# Patient Record
Sex: Female | Born: 1980 | Race: Black or African American | Hispanic: No | Marital: Married | Smoking: Never smoker
Health system: Southern US, Community
[De-identification: ages and names within clinical notes are randomized; demographics above are authoritative.]

## PROBLEM LIST (undated history)

## (undated) ENCOUNTER — Emergency Department (HOSPITAL_COMMUNITY): Admission: EM | Payer: 59

## (undated) DIAGNOSIS — K649 Unspecified hemorrhoids: Secondary | ICD-10-CM

## (undated) DIAGNOSIS — D219 Benign neoplasm of connective and other soft tissue, unspecified: Secondary | ICD-10-CM

## (undated) DIAGNOSIS — T7840XA Allergy, unspecified, initial encounter: Secondary | ICD-10-CM

## (undated) DIAGNOSIS — K219 Gastro-esophageal reflux disease without esophagitis: Secondary | ICD-10-CM

## (undated) DIAGNOSIS — J302 Other seasonal allergic rhinitis: Secondary | ICD-10-CM

## (undated) DIAGNOSIS — D649 Anemia, unspecified: Secondary | ICD-10-CM

## (undated) DIAGNOSIS — Z973 Presence of spectacles and contact lenses: Secondary | ICD-10-CM

## (undated) DIAGNOSIS — D259 Leiomyoma of uterus, unspecified: Secondary | ICD-10-CM

## (undated) DIAGNOSIS — K5190 Ulcerative colitis, unspecified, without complications: Secondary | ICD-10-CM

## (undated) DIAGNOSIS — D509 Iron deficiency anemia, unspecified: Secondary | ICD-10-CM

## (undated) DIAGNOSIS — D500 Iron deficiency anemia secondary to blood loss (chronic): Secondary | ICD-10-CM

## (undated) DIAGNOSIS — K429 Umbilical hernia without obstruction or gangrene: Secondary | ICD-10-CM

## (undated) HISTORY — DX: Gastro-esophageal reflux disease without esophagitis: K21.9

## (undated) HISTORY — PX: NO PAST SURGERIES: SHX2092

## (undated) HISTORY — DX: Benign neoplasm of connective and other soft tissue, unspecified: D21.9

## (undated) HISTORY — DX: Allergy, unspecified, initial encounter: T78.40XA

## (undated) HISTORY — DX: Ulcerative colitis, unspecified, without complications: K51.90

## (undated) HISTORY — DX: Anemia, unspecified: D64.9

---

## 2002-02-09 ENCOUNTER — Other Ambulatory Visit: Admission: RE | Admit: 2002-02-09 | Discharge: 2002-02-09 | Payer: Self-pay | Admitting: Internal Medicine

## 2009-10-15 ENCOUNTER — Emergency Department (HOSPITAL_COMMUNITY): Admission: EM | Admit: 2009-10-15 | Discharge: 2009-10-15 | Payer: Self-pay | Admitting: Emergency Medicine

## 2011-02-19 ENCOUNTER — Inpatient Hospital Stay (INDEPENDENT_AMBULATORY_CARE_PROVIDER_SITE_OTHER)
Admission: RE | Admit: 2011-02-19 | Discharge: 2011-02-19 | Disposition: A | Discharge status: Home / Self Care | Payer: 59 | Source: Ambulatory Visit | Attending: Family Medicine | Admitting: Family Medicine

## 2011-02-19 DIAGNOSIS — B354 Tinea corporis: Secondary | ICD-10-CM

## 2012-05-14 ENCOUNTER — Encounter (HOSPITAL_COMMUNITY): Payer: Self-pay

## 2012-05-14 ENCOUNTER — Emergency Department (HOSPITAL_COMMUNITY)
Admission: EM | Admit: 2012-05-14 | Discharge: 2012-05-14 | Disposition: A | Discharge status: Home / Self Care | Payer: 59 | Source: Home / Self Care | Attending: Emergency Medicine | Admitting: Emergency Medicine

## 2012-05-14 DIAGNOSIS — L259 Unspecified contact dermatitis, unspecified cause: Secondary | ICD-10-CM

## 2012-05-14 DIAGNOSIS — L309 Dermatitis, unspecified: Secondary | ICD-10-CM

## 2012-05-14 DIAGNOSIS — K2970 Gastritis, unspecified, without bleeding: Secondary | ICD-10-CM

## 2012-05-14 LAB — POCT URINALYSIS DIP (DEVICE)
Glucose, UA: NEGATIVE mg/dL
Hgb urine dipstick: NEGATIVE
Leukocytes, UA: NEGATIVE
Nitrite: NEGATIVE
Urobilinogen, UA: 0.2 mg/dL (ref 0.0–1.0)
pH: 6 (ref 5.0–8.0)

## 2012-05-14 LAB — POCT H PYLORI SCREEN: H. PYLORI SCREEN, POC: NEGATIVE

## 2012-05-14 MED ORDER — GI COCKTAIL ~~LOC~~
30.0000 mL | Freq: Once | ORAL | Status: AC
  Administered 2012-05-14: 30 mL via ORAL

## 2012-05-14 MED ORDER — SUCRALFATE 1 GM/10ML PO SUSP: 1.0000 g | Freq: Four times a day (QID) | ORAL | Status: DC

## 2012-05-14 MED ORDER — KETOCONAZOLE-HYDROCORTISONE 2 & 1 % (CREAM) EX KIT: 1.0000 "application " | PACK | Freq: Two times a day (BID) | CUTANEOUS | Status: DC

## 2012-05-14 MED ORDER — OMEPRAZOLE 20 MG PO CPDR: 20.0000 mg | DELAYED_RELEASE_CAPSULE | Freq: Two times a day (BID) | ORAL | Status: DC

## 2012-05-14 MED ORDER — GI COCKTAIL ~~LOC~~
ORAL | Status: AC
  Filled 2012-05-14: qty 30

## 2012-05-14 MED ORDER — RANITIDINE HCL 150 MG PO CAPS: 150.0000 mg | ORAL_CAPSULE | Freq: Two times a day (BID) | ORAL | Status: DC

## 2012-05-14 NOTE — Discharge Instructions (Signed)
[facility]: [address] [place] [address] [phone]  [facility]: [address] [place] [address]   [phone]  [facility]: [address] [place] [address] [phone]  [facility] : [address] [place] [address] [phone]  [facility]: [address] [place] [address] [phone]  [facility]: [address] [place] [address] [phone]  Dr. Mahima Pandey [address] [facility] [place]  [address] [phone]  

## 2012-05-14 NOTE — ED Notes (Signed)
Pt states she started with epigastric pain on Wednesday one day after finishing her period.  States it was a burning achy feeling that she noticed after eating.

## 2012-05-14 NOTE — ED Provider Notes (Signed)
History     CSN: [id number]  Arrival date & time 05/14/12  1127   First MD Initiated Contact with Patient 05/14/12 1136      Chief Complaint  Patient presents with  . Abdominal Pain    (Consider location/radiation/quality/duration/timing/severity/associated sxs/prior treatment) HPI Comments: Patient presents with 2 complaints: First, patient reports burning epigastric pain, which is worse after eating over the past 2 days. She has been drinking alcohol in the evenings over the past 2 days, she states she does not drink frequently. She was not having any symptoms prior to drinking. States she just finished menses, but denies taking anything other than Tylenol for her menstrual pain.  Reports some increased belching. No nausea, vomiting, abdominal distention, change in bowel habit, urinary complaints, vaginal complaints. No other abdominal pain. Patient does not smoke. She does report some increased stress. She is also taking some prenatal vitamins with iron in them, and this is relatively new for her.  Second, patient reports a itchy, flat, dry patchy skin over her forehead starting about a month and a half ago. States this started after she burned her forehead with a curling iron.  it has not spread since it started. No nausea, vomiting, rash elsewhere. She tried a leftover prescription of betamethasone, with moderate relief, but she states she ran out. States this is identical to the dermatitis that she had previously, for which she was prescribed topical steroids by a dermatologist. She is requesting another prescription.   ROS as noted in HPI. All other ROS negative.   Patient is a 31 y.o. female presenting with abdominal pain and rash. The history is provided by the patient. No language interpreter was used.  Abdominal Pain The primary symptoms of the illness include abdominal pain. The current episode started 2 days ago. The onset of the illness was sudden.  The illness is associated  with alcohol use. The patient states that she believes she is currently not pregnant. The patient has not had a change in bowel habit. Symptoms associated with the illness do not include chills, anorexia, diaphoresis, heartburn, constipation, urgency, hematuria, frequency or back pain. Significant associated medical issues do not include PUD, GERD, diabetes or gallstones.  Rash  This is a recurrent problem. The current episode started more than 1 week ago. The problem has not changed since onset.The problem is associated with an unknown factor. There has been no fever. The rash is present on the face. The pain has been constant since onset. Associated symptoms include itching. Pertinent negatives include no blisters, no pain and no weeping. Treatments tried: betamethasone cream. The treatment provided moderate relief.    History reviewed. No pertinent past medical history.  History reviewed. No pertinent past surgical history.  History reviewed. No pertinent family history.  History  Substance Use Topics  . Smoking status: Never Smoker   . Smokeless tobacco: Not on file  . Alcohol Use: Yes     occ     OB History    Grav Para Term Preterm Abortions TAB SAB Ect Mult Living                  Review of Systems  Constitutional: Negative for chills and diaphoresis.  Gastrointestinal: Positive for abdominal pain. Negative for heartburn, constipation and anorexia.  Genitourinary: Negative for urgency, frequency and hematuria.  Musculoskeletal: Negative for back pain.  Skin: Positive for itching and rash.    Allergies  Review of patient's allergies indicates no known allergies.  Home Medications  Current Outpatient Rx  Name Route Sig Dispense Refill  . ACETAMINOPHEN 325 MG PO TABS Oral Take 650 mg by mouth every 6 (six) hours as needed.    Marland Kitchen PRENATAL MULTIVITAMIN CH Oral Take 1 tablet by mouth daily.    Marland Kitchen KETOCONAZOLE-HYDROCORTISONE 2 & 1 % (CREAM) EX KIT Apply externally Apply 1  application topically 2 (two) times daily. X 2 weeks 1 kit 0  . OMEPRAZOLE 20 MG PO CPDR Oral Take 1 capsule (20 mg total) by mouth 2 (two) times daily. X 10 days 40 capsule 0  . RANITIDINE HCL 150 MG PO CAPS Oral Take 1 capsule (150 mg total) by mouth 2 (two) times daily. 30 capsule 0  . SUCRALFATE 1 GM/10ML PO SUSP Oral Take 10 mLs (1 g total) by mouth 4 (four) times daily. 10 mL before meals and before bedtime 240 mL 0    BP 150/94  Pulse 86  Temp 98.7 F (37.1 C) (Oral)  Resp 18  SpO2 100%  LMP 05/10/2012  Physical Exam  Nursing note and vitals reviewed. Constitutional: She is oriented to person, place, and time. She appears well-developed and well-nourished.  HENT:  Head: Normocephalic and atraumatic.         Flat, scaly, hyperpigmented rash, see drawing. No signs of infection.  Eyes: Conjunctivae and EOM are normal.  Neck: Normal range of motion.  Cardiovascular: Normal rate, regular rhythm, normal heart sounds and intact distal pulses.   No murmur heard. Pulmonary/Chest: Effort normal and breath sounds normal.  Abdominal: Soft. Normal appearance and bowel sounds are normal. She exhibits no distension and no mass. There is tenderness in the epigastric area. There is no rigidity, no rebound, no guarding, no CVA tenderness, no tenderness at McBurney's point and negative Murphy's sign.    Musculoskeletal: Normal range of motion.  Neurological: She is alert and oriented to person, place, and time.  Skin: Skin is warm and dry. Rash noted.       see HEENT exam  Psychiatric: She has a normal mood and affect. Her behavior is normal. Judgment and thought content normal.    ED Course  Procedures (including critical care time)   Labs Reviewed  POCT URINALYSIS DIP (DEVICE)  POCT PREGNANCY, URINE  POCT H PYLORI SCREEN   No results found.   1. Gastritis   2. Dermatitis    Results for orders placed during the hospital encounter of 05/14/12  POCT URINALYSIS DIP (DEVICE)       Component Value Range   Glucose, UA NEGATIVE  NEGATIVE mg/dL   Bilirubin Urine NEGATIVE  NEGATIVE   Ketones, ur NEGATIVE  NEGATIVE mg/dL   Specific Gravity, Urine 1.020  1.005 - 1.030   Hgb urine dipstick NEGATIVE  NEGATIVE   pH 6.0  5.0 - 8.0   Protein, ur NEGATIVE  NEGATIVE mg/dL   Urobilinogen, UA 0.2  0.0 - 1.0 mg/dL   Nitrite NEGATIVE  NEGATIVE   Leukocytes, UA NEGATIVE  NEGATIVE  POCT PREGNANCY, URINE      Component Value Range   Preg Test, Ur NEGATIVE  NEGATIVE  POCT H PYLORI SCREEN      Component Value Range   H. PYLORI SCREEN, POC NEGATIVE  NEGATIVE    MDM  Previous records reviewed. Noncontributory. H&P most consistent with gastritis. H. pylori negative. Pt abd exam is benign, no peritoneal signs. No evidence of surgical abd. Doubt SBO, mesenteric ischemia, appendicitis, hepatitis, cholecystitis, pancreatitis, or perforated viscus. Will have her discontinue the prenatal vitamins,  send her home with H2, PPI, Carafate. She also has a dermatitis on her face, suspect secondary yeast infection because of the itching. Will send her home with ketoconazole/hydrocortisone. We'll refer her to primary care practices for routine care. Discussed signs and symptoms that should prompt return. Patient agrees with plan.  Luiz Blare, MD 05/14/12 [phone]

## 2012-09-16 ENCOUNTER — Emergency Department (HOSPITAL_COMMUNITY)
Admission: EM | Admit: 2012-09-16 | Discharge: 2012-09-16 | Disposition: A | Discharge status: Home / Self Care | Payer: 59 | Source: Home / Self Care | Attending: Emergency Medicine | Admitting: Emergency Medicine

## 2012-09-16 ENCOUNTER — Encounter (HOSPITAL_COMMUNITY): Payer: Self-pay

## 2012-09-16 DIAGNOSIS — M79609 Pain in unspecified limb: Secondary | ICD-10-CM

## 2012-09-16 DIAGNOSIS — M79644 Pain in right finger(s): Secondary | ICD-10-CM

## 2012-09-16 MED ORDER — CEPHALEXIN 500 MG PO CAPS: 500.0000 mg | ORAL_CAPSULE | Freq: Two times a day (BID) | ORAL | Status: DC

## 2012-09-16 NOTE — ED Notes (Signed)
States she pulled off a hangnail a few days ago , and since then, has had swelling, pain in her right thumb

## 2012-09-16 NOTE — ED Provider Notes (Signed)
History     CSN: [id number]  Arrival date & time 09/16/12  1422   None     Chief Complaint  Patient presents with  . Hand Pain    (Consider location/radiation/quality/duration/timing/severity/associated sxs/prior treatment) Patient is a 31 y.o. female presenting with hand pain. The history is provided by the patient.  Hand Pain This is a new problem.  patient reports hang nail on right thumb removed one week ago.  Since then she has had swelling and tenderness to right thumb, improved with warm soaks.  States pain is worse with movement and palpation.  No prior history of same.  No change in hand function.  History reviewed. No pertinent past medical history.  History reviewed. No pertinent past surgical history.  History reviewed. No pertinent family history.  History  Substance Use Topics  . Smoking status: Never Smoker   . Smokeless tobacco: Not on file  . Alcohol Use: Yes     occ     OB History    Grav Para Term Preterm Abortions TAB SAB Ect Mult Living                  Review of Systems  Constitutional: Negative.   Respiratory: Negative.   Cardiovascular: Negative.   Musculoskeletal: Positive for arthralgias.  Skin: Negative.   Neurological: Negative.     Allergies  Review of patient's allergies indicates no known allergies.  Home Medications   Current Outpatient Rx  Name Route Sig Dispense Refill  . ACETAMINOPHEN 325 MG PO TABS Oral Take 650 mg by mouth every 6 (six) hours as needed.    . CEPHALEXIN 500 MG PO CAPS Oral Take 1 capsule (500 mg total) by mouth 2 (two) times daily. 20 capsule 0  . KETOCONAZOLE-HYDROCORTISONE 2 & 1 % (CREAM) EX KIT Apply externally Apply 1 application topically 2 (two) times daily. X 2 weeks 1 kit 0  . OMEPRAZOLE 20 MG PO CPDR Oral Take 1 capsule (20 mg total) by mouth 2 (two) times daily. X 10 days 40 capsule 0  . PRENATAL MULTIVITAMIN CH Oral Take 1 tablet by mouth daily.    Marland Kitchen RANITIDINE HCL 150 MG PO CAPS Oral Take 1  capsule (150 mg total) by mouth 2 (two) times daily. 30 capsule 0  . SUCRALFATE 1 GM/10ML PO SUSP Oral Take 10 mLs (1 g total) by mouth 4 (four) times daily. 10 mL before meals and before bedtime 240 mL 0    BP 127/75  Pulse 104  Temp 98.9 F (37.2 C) (Oral)  Resp 12  SpO2 99%  Physical Exam  Nursing note and vitals reviewed. Constitutional: She is oriented to person, place, and time. Vital signs are normal. She appears well-developed and well-nourished. She is active and cooperative.  HENT:  Head: Normocephalic.  Eyes: Conjunctivae normal are normal. Pupils are equal, round, and reactive to light. No scleral icterus.  Neck: Trachea normal. Neck supple.  Cardiovascular: Normal rate and regular rhythm.   Pulmonary/Chest: Effort normal and breath sounds normal.  Musculoskeletal:       Right wrist: Normal.       Right hand: Normal.       Hands: Neurological: She is alert and oriented to person, place, and time. She has normal strength and normal reflexes. No cranial nerve deficit or sensory deficit. GCS eye subscore is 4. GCS verbal subscore is 5. GCS motor subscore is 6.  Skin: Skin is warm and dry.  Psychiatric: She has a normal mood  and affect. Her speech is normal and behavior is normal. Judgment and thought content normal. Cognition and memory are normal.    ED Course  Procedures (including critical care time)  Labs Reviewed - No data to display No results found.   1. Finger pain, right       MDM  Take antibiotics as prescribed, ibuprofen for pain, continue warm soaks.        Dana Kindred, NP 09/16/12 1631

## 2012-09-17 NOTE — ED Provider Notes (Signed)
Medical screening examination/treatment/procedure(s) were performed by non-physician practitioner and as supervising physician I was immediately available for consultation/collaboration.  Leslee Home, M.D.   Reuben Likes, MD 09/17/12 [phone]

## 2014-09-06 ENCOUNTER — Emergency Department (HOSPITAL_COMMUNITY)
Admission: EM | Admit: 2014-09-06 | Discharge: 2014-09-06 | Disposition: A | Discharge status: Home / Self Care | Payer: 59 | Source: Home / Self Care | Attending: Emergency Medicine | Admitting: Emergency Medicine

## 2014-09-06 ENCOUNTER — Encounter (HOSPITAL_COMMUNITY): Payer: Self-pay | Admitting: Emergency Medicine

## 2014-09-06 DIAGNOSIS — H8112 Benign paroxysmal vertigo, left ear: Secondary | ICD-10-CM

## 2014-09-06 MED ORDER — LORAZEPAM 1 MG PO TABS: 1.0000 mg | ORAL_TABLET | Freq: Three times a day (TID) | ORAL | Status: DC

## 2014-09-06 MED ORDER — MECLIZINE HCL 25 MG PO TABS: 25.0000 mg | ORAL_TABLET | Freq: Three times a day (TID) | ORAL | Status: DC | PRN

## 2014-09-06 NOTE — ED Notes (Signed)
Reports dizziness and ear pain that started 10/2, intermittent.  Dizziness asscoiated with changing of body position, noted room spinning with lying down.  Denies uri symptoms, bilateral ear pressure and popping

## 2014-09-06 NOTE — ED Provider Notes (Signed)
  Chief Complaint   Dizziness and Otalgia   History of Present Illness   Dana Campbell is a 33 year old female who has had a three-day history of bilateral ear congestion pressure and spinning vertigo. The this is intermittent and only occurs in certain positions. Is been accompanied by nausea. The patient notes diminished hearing and some ringing in the ears. She's had no fever, chills, headache, diplopia, or blurry vision. No nasal congestion, drainage, sore throat or stiff neck. She denies any chest pain, shortness of breath, or palpitations. She's had no paresthesias, muscle weakness, difficulty with speech or difficulty with ambulation.  Review of Systems   Other than noted above, the patient denies any of the following symptoms: Systemic:  No fever, chills, fatigue, or weight loss. Eye:  No blurred vision, visual change or diplopia. ENT:  No ear pain, tinnitus, hearing loss, nasal congestion, or rhinorrhea. Cardiac:  No chest pain, dyspnea, palpitations or syncope. Neuro:  No headache, paresthesias, weakness, trouble with speech, coordination or ambulation.  [place]   Past medical history, family history, social history, meds, and allergies were reviewed.    Physical Examination     Vital signs:  BP 136/92  Pulse 78  Temp(Src) 97.7 F (36.5 C) (Oral)  Resp 18  SpO2 99%  LMP 08/18/2014 General:  Alert, oriented times 3, in no distress. Eye:  PERRL, full EOM, no nystagmus at rest. ENT:  TMs and canals normal.  Nasal mucosa normal.  Pharynx clear. Neck:  No adenopathy, tenderness, or mass.  Thyroid normal.  No carotid bruit. Lungs:  Breath sounds clear and equal bilaterally.  No wheezes, rales or rhonchi. Heart:  Regular rhythm.  No gallops, murmers, or rubs. Neuro:  Alert and oriented times 3.  Cranial nerves intact.  No pronator drift.  Finger to nose normal.  No focal weakness.  Sensation intact to light touch.  Romberg's sign negative, gait normal.  Able to do tandem gait  well.  Dix-Hallpike maneuver was positive with the left ear down with visible nystagmus.  [facility]   The Epley maneuver was done once.  Assessment   The encounter diagnosis was Benign positional vertigo, left.  No evidence of presyncope or central vertigo.    Plan     1.  Meds:  The following meds were prescribed:   Discharge Medication List as of 09/06/2014  9:55 AM    START taking these medications   Details  LORazepam (ATIVAN) 1 MG tablet Take 1 tablet (1 mg total) by mouth every 8 (eight) hours., Starting 09/06/2014, Until Discontinued, Print    meclizine (ANTIVERT) 25 MG tablet Take 1 tablet (25 mg total) by mouth 3 (three) times daily as needed for dizziness., Starting 09/06/2014, Until Discontinued, Normal        2.  Patient Education/Counseling:  The patient was given appropriate handouts, self care instructions, and instructed in symptomatic relief.  She was given an Epley exercises to do. If no better in a week suggested she followup with ENT.  3.  Follow up:  The patient was told to follow up here if no better in 3 to 4 days, or sooner if becoming worse in any way, and given some red flag symptoms such as new neurological symptoms, chest pain, or syncope which would prompt immediate return.       Harden Mo, MD 09/06/14 1351

## 2014-09-06 NOTE — Discharge Instructions (Signed)
You have been diagnosed with benign positional vertigo.  The cause of this is a displaced particle or crystal in the inner ear.  This can be cured by doing the exercise described below, called the Epley exercise developed by Dr. Horald Chestnut.  Do this exercise three times daily until you are able to do it for 24 hours without getting dizzy.  Sleep with your head elevated and try to avoid bending over.  If the dizziness is not better in 7 days, return here, see your primary care doctor or ENT doctor.    Benign Positional Vertigo Vertigo means you feel like you or your surroundings are moving when they are not. Benign positional vertigo is the most common form of vertigo. Benign means that the cause of your condition is not serious. Benign positional vertigo is more common in older adults. CAUSES  Benign positional vertigo is the result of an upset in the labyrinth system. This is an area in the middle ear that helps control your balance. This may be caused by a viral infection, head injury, or repetitive motion. However, often no specific cause is found. SYMPTOMS  Symptoms of benign positional vertigo occur when you move your head or eyes in different directions. Some of the symptoms may include:  Loss of balance and falls.  Vomiting.  Blurred vision.  Dizziness.  Nausea.  Involuntary eye movements (nystagmus). DIAGNOSIS  Benign positional vertigo is usually diagnosed by physical exam. If the specific cause of your benign positional vertigo is unknown, your caregiver may perform imaging tests, such as magnetic resonance imaging (MRI) or computed tomography (CT). TREATMENT  Your caregiver may recommend movements or procedures to correct the benign positional vertigo. Medicines such as meclizine, benzodiazepines, and medicines for nausea may be used to treat your symptoms. In rare cases, if your symptoms are caused by certain conditions that affect the inner ear, you may need surgery. HOME CARE  INSTRUCTIONS   Follow your caregiver's instructions.  Move slowly. Do not make sudden body or head movements.  Avoid driving.  Avoid operating heavy machinery.  Avoid performing any tasks that would be dangerous to you or others during a vertigo episode.  Drink enough fluids to keep your urine clear or pale yellow. SEEK IMMEDIATE MEDICAL CARE IF:   You develop problems with walking, weakness, numbness, or using your arms, hands, or legs.  You have difficulty speaking.  You develop severe headaches.  Your nausea or vomiting continues or gets worse.  You develop visual changes.  Your family or friends notice any behavioral changes.  Your condition gets worse.  You have a fever.  You develop a stiff neck or sensitivity to light. MAKE SURE YOU:   Understand these instructions.  Will watch your condition.  Will get help right away if you are not doing well or get worse. Document Released: 08/24/2006 Document Revised: 02/08/2012 Document Reviewed: 08/06/2011 [facility] Patient Information 2015 [place], [place]. This information is not intended to replace advice given to you by your health care provider. Make sure you discuss any questions you have with your health care provider.

## 2014-12-18 ENCOUNTER — Ambulatory Visit (INDEPENDENT_AMBULATORY_CARE_PROVIDER_SITE_OTHER): Payer: 59 | Admitting: Physician Assistant

## 2014-12-18 VITALS — BP 128/90 | HR 78 | Temp 98.7°F | Resp 18 | Ht 65.0 in | Wt 291.4 lb

## 2014-12-18 DIAGNOSIS — H00016 Hordeolum externum left eye, unspecified eyelid: Secondary | ICD-10-CM

## 2014-12-18 NOTE — Progress Notes (Signed)
   Subjective:    Patient ID: Dana Campbell, female    DOB: January 10, 1981, 34 y.o.   MRN: [id number]  HPI Pt presents to clinic with left lower eyelid swollen and painful.  Started yesterday and got worse this am and she got worried.  Yesterday she thought it was her sinuses s she took sudafed but then this am the pressure//pain was different and she has no cold symptoms.  No problems with her vision or eyeball.  She has never had problems with her BP before.  She knows she needs to loose weight and has lost 100lbs in the past but gained it back due to her stress eating with recent stressors.  She has a bad family h/o hypertension.  Review of Systems  Constitutional: Negative for fever and chills.  HENT: Negative for congestion and sinus pressure.   Eyes: Positive for discharge (tearing). Negative for photophobia, pain and visual disturbance.  Respiratory: Negative for cough.        Objective:   Physical Exam  Constitutional: She is oriented to person, place, and time. She appears well-developed and well-nourished.  BP 128/90 mmHg  Pulse 78  Temp(Src) 98.7 F (37.1 C) (Oral)  Resp 18  Ht 5\' 5"  (1.651 m)  Wt 291 lb 6.4 oz (132.178 kg)  BMI 48.49 kg/m2  SpO2 100%  LMP 12/08/2014   HENT:  Head: Normocephalic and atraumatic.  Right Ear: External ear normal.  Left Ear: External ear normal.  Eyes: Conjunctivae and EOM are normal. Pupils are equal, round, and reactive to light. Left eye exhibits hordeolum. Left eye exhibits no discharge. No foreign body present in the left eye.    No swelling at the lacrimal gland on the left, no erythema seen near tear duct.  Cardiovascular: Normal rate, regular rhythm and normal heart sounds.   No murmur heard. Pulmonary/Chest: Effort normal and breath sounds normal. She has no wheezes.  Neurological: She is alert and oriented to person, place, and time.  Skin: Skin is warm and dry.  Psychiatric: She has a normal mood and affect. Her behavior is  normal. Judgment and thought content normal.       Assessment & Plan:  Stye external, left   Elevated BP - pt to monitor her BP and if the diastolic stays elevated she will need evaluation  Warm compresses and no tears baby shampoo to wash out the area.  There is not sign on infection at this time.   Windell Hummingbird PA-C  [facility] 12/18/2014 11:40 AM

## 2014-12-18 NOTE — Patient Instructions (Addendum)
Warm compresses  Wynetta Emery and johnson baby shampoo no tear - to wash your eye  Sty A sty (hordeolum) is an infection of a gland in the eyelid located at the base of the eyelash. A sty may develop a white or yellow head of pus. It can be puffy (swollen). Usually, the sty will burst and pus will come out on its own. They do not leave lumps in the eyelid once they drain. A sty is often confused with another form of cyst of the eyelid called a chalazion. Chalazions occur within the eyelid and not on the edge where the bases of the eyelashes are. They often are red, sore and then form firm lumps in the eyelid. CAUSES   Germs (bacteria).  Lasting (chronic) eyelid inflammation. SYMPTOMS   Tenderness, redness and swelling along the edge of the eyelid at the base of the eyelashes.  Sometimes, there is a white or yellow head of pus. It may or may not drain. DIAGNOSIS  An ophthalmologist will be able to distinguish between a sty and a chalazion and treat the condition appropriately.  TREATMENT   Styes are typically treated with warm packs (compresses) until drainage occurs.  In rare cases, medicines that kill germs (antibiotics) may be prescribed. These antibiotics may be in the form of drops, cream or pills.  If a hard lump has formed, it is generally necessary to do a small incision and remove the hardened contents of the cyst in a minor surgical procedure done in the office.  In suspicious cases, your caregiver may send the contents of the cyst to the lab to be certain that it is not a rare, but dangerous form of cancer of the glands of the eyelid. HOME CARE INSTRUCTIONS   Wash your hands often and dry them with a clean towel. Avoid touching your eyelid. This may spread the infection to other parts of the eye.  Apply heat to your eyelid for 10 to 20 minutes, several times a day, to ease pain and help to heal it faster.  Do not squeeze the sty. Allow it to drain on its own. Wash your eyelid  carefully 3 to 4 times per day to remove any pus. SEEK IMMEDIATE MEDICAL CARE IF:   Your eye becomes painful or puffy (swollen).  Your vision changes.  Your sty does not drain by itself within 3 days.  Your sty comes back within a short period of time, even with treatment.  You have redness (inflammation) around the eye.  You have a fever. Document Released: 08/26/2005 Document Revised: 02/08/2012 Document Reviewed: 03/02/2014 [facility] Patient Information 2015 [place], [place]. This information is not intended to replace advice given to you by your health care provider. Make sure you discuss any questions you have with your health care provider.

## 2018-05-26 ENCOUNTER — Ambulatory Visit (INDEPENDENT_AMBULATORY_CARE_PROVIDER_SITE_OTHER): Payer: Self-pay | Admitting: Family Medicine

## 2018-05-26 VITALS — BP 120/88 | HR 78 | Temp 98.3°F | Resp 18 | Wt 286.8 lb

## 2018-05-26 DIAGNOSIS — Z0000 Encounter for general adult medical examination without abnormal findings: Secondary | ICD-10-CM

## 2018-05-26 NOTE — Progress Notes (Signed)
Dana Campbell is a 37 y.o. female who presents today with concerns of needs a PCP other wise healthy lost 68 lbs in last 6-7 months and works out [employer]. Denies chronic health conditions  Review of Systems  Constitutional: Negative for chills, fever and malaise/fatigue.  HENT: Negative for congestion, ear discharge, ear pain, sinus pain and sore throat.   Eyes: Negative.   Respiratory: Negative for cough, sputum production and shortness of breath.   Cardiovascular: Negative.  Negative for chest pain.  Gastrointestinal: Negative for abdominal pain, diarrhea, nausea and vomiting.  Genitourinary: Negative for dysuria, frequency, hematuria and urgency.  Musculoskeletal: Negative for myalgias.  Skin: Negative.   Neurological: Negative for headaches.  Endo/Heme/Allergies: Negative.   Psychiatric/Behavioral: Negative.     O: Vitals:   05/26/18 1737  BP: 120/88  Pulse: 78  Resp: 18  Temp: 98.3 F (36.8 C)  SpO2: 99%     Physical Exam  Constitutional: She is oriented to person, place, and time. Vital signs are normal. She appears well-developed and well-nourished. She is active.  Non-toxic appearance. She does not have a sickly appearance.  HENT:  Head: Normocephalic.  Right Ear: Hearing, tympanic membrane, external ear and ear canal normal.  Left Ear: Hearing, tympanic membrane, external ear and ear canal normal.  Nose: Nose normal.  Mouth/Throat: Uvula is midline and oropharynx is clear and moist.  Neck: Normal range of motion. Neck supple.  Cardiovascular: Normal rate, regular rhythm, normal heart sounds and normal pulses.  Pulmonary/Chest: Effort normal and breath sounds normal.  Abdominal: Soft. Bowel sounds are normal.  Musculoskeletal: Normal range of motion.  Lymphadenopathy:       Head (right side): No submental and no submandibular adenopathy present.       Head (left side): No submental and no submandibular adenopathy present.    She has no cervical adenopathy.   Neurological: She is alert and oriented to person, place, and time.  Psychiatric: She has a normal mood and affect.  PHQ-9- negative  Vitals reviewed.    A: 1. Physical exam      P: Exam findings, diagnosis etiology and medication use and indications reviewed with patient. Follow- Up and discharge instructions provided. No emergent/urgent issues found on exam.  Patient verbalized understanding of information provided and agrees with plan of care (POC), all questions answered.  1. Physical exam WNL

## 2018-05-26 NOTE — Patient Instructions (Signed)

## 2018-09-14 ENCOUNTER — Ambulatory Visit (HOSPITAL_COMMUNITY)
Admission: EM | Admit: 2018-09-14 | Discharge: 2018-09-14 | Disposition: A | Discharge status: Home / Self Care | Payer: 59 | Attending: Family Medicine | Admitting: Family Medicine

## 2018-09-14 ENCOUNTER — Encounter (HOSPITAL_COMMUNITY): Payer: Self-pay | Admitting: Emergency Medicine

## 2018-09-14 ENCOUNTER — Other Ambulatory Visit: Payer: Self-pay

## 2018-09-14 DIAGNOSIS — R5383 Other fatigue: Secondary | ICD-10-CM | POA: Diagnosis not present

## 2018-09-14 DIAGNOSIS — R509 Fever, unspecified: Secondary | ICD-10-CM

## 2018-09-14 DIAGNOSIS — R000 Tachycardia, unspecified: Secondary | ICD-10-CM

## 2018-09-14 LAB — CBC WITH DIFFERENTIAL/PLATELET
Abs Immature Granulocytes: 0.04 10*3/uL (ref 0.00–0.07)
BASOS ABS: 0 10*3/uL (ref 0.0–0.1)
Basophils Relative: 0 %
EOS PCT: 0 %
Eosinophils Absolute: 0 10*3/uL (ref 0.0–0.5)
HCT: 39.2 % (ref 36.0–46.0)
HEMOGLOBIN: 12.6 g/dL (ref 12.0–15.0)
Immature Granulocytes: 0 %
LYMPHS ABS: 0.8 10*3/uL (ref 0.7–4.0)
LYMPHS PCT: 8 %
MCH: 28.3 pg (ref 26.0–34.0)
MCHC: 32.1 g/dL (ref 30.0–36.0)
MCV: 88.1 fL (ref 80.0–100.0)
MONO ABS: 1.2 10*3/uL — AB (ref 0.1–1.0)
Monocytes Relative: 11 %
NRBC: 0 % (ref 0.0–0.2)
Neutro Abs: 8.5 10*3/uL — ABNORMAL HIGH (ref 1.7–7.7)
Neutrophils Relative %: 81 %
Platelets: 194 10*3/uL (ref 150–400)
RBC: 4.45 MIL/uL (ref 3.87–5.11)
RDW: 14 % (ref 11.5–15.5)
WBC: 10.5 10*3/uL (ref 4.0–10.5)

## 2018-09-14 LAB — POCT URINALYSIS DIP (DEVICE)
GLUCOSE, UA: NEGATIVE mg/dL
KETONES UR: 40 mg/dL — AB
Leukocytes, UA: NEGATIVE
Nitrite: NEGATIVE
PH: 6 (ref 5.0–8.0)
PROTEIN: 100 mg/dL — AB
UROBILINOGEN UA: 0.2 mg/dL (ref 0.0–1.0)

## 2018-09-14 MED ORDER — ACETAMINOPHEN 325 MG PO TABS
650.0000 mg | ORAL_TABLET | Freq: Once | ORAL | Status: AC
  Administered 2018-09-14: 650 mg via ORAL

## 2018-09-14 MED ORDER — DOXYCYCLINE HYCLATE 100 MG PO CAPS: 100.0000 mg | ORAL_CAPSULE | Freq: Two times a day (BID) | ORAL | 0 refills | Status: DC

## 2018-09-14 MED ORDER — ACETAMINOPHEN 325 MG PO TABS
ORAL_TABLET | ORAL | Status: AC
  Filled 2018-09-14: qty 2

## 2018-09-14 MED FILL — DOXYCYCLINE HYC 100 MG CAPS: 100 | 10 days supply | Qty: 20 | Fill #0

## 2018-09-14 NOTE — Discharge Instructions (Addendum)
Urine did not show signs of urinary tract infection Blood work obtained.  We will follow up with you regarding the results We will start you on doxycycline for empiric treatment of tick borne illness Continue to alternate ibuprofen and tylenol every 6-8 hours Please schedule appointment with PCP this week or next week for reevaluation Return or go to the ED if you have any new or worsening symptoms such as fever that does not moderate with tylenol or ibuprofen, cough, abdominal pain, vomiting, diarrhea, neck pain, severe headache, rash, etc..Marland Kitchen

## 2018-09-14 NOTE — ED Notes (Signed)
Notified dr hagler of patient's fever

## 2018-09-14 NOTE — ED Provider Notes (Addendum)
[facility]   [id number] 09/14/18 Arrival Time: 3875  IE:PPIRJ  SUBJECTIVE: History from: patient.  Dana Campbell is a 37 y.o. female who presents with complaint of fever that began 3 days ago.  Tmax at home was 103 last night, 102.8 in office today.  Denies precipitating event or positive sick exposure.  Denies recent travel or known tick exposure, but did go on a hike recently.  States she is in a monogamous relationship with her female partner of 1 year.  Has tried OTC tylenol with relief.  Denies aggravating or alleviating factors.  Denies similar symptoms in the past. Complains of decreased appetite and fatigue.  Denies ear pain, sore throat, sinus pain/pressure, dental pain, neck pain or stiffness, chest pain, SOB, cough, abdominal pain, vomiting, diarrhea, dysuria, urinary urgency, or rash.    There is no immunization history on file for this patient.   Currently on menstrual cycle  ROS: As per HPI.  History reviewed. No pertinent past medical history. History reviewed. No pertinent surgical history. Allergies  Allergen Reactions  . Cinnamon Itching   No current facility-administered medications on file prior to encounter.    Current Outpatient Medications on File Prior to Encounter  Medication Sig Dispense Refill  . acetaminophen (TYLENOL) 325 MG tablet Take 650 mg by mouth every 6 (six) hours as needed.    . meclizine (ANTIVERT) 25 MG tablet Take 1 tablet (25 mg total) by mouth 3 (three) times daily as needed for dizziness. 30 tablet 0  . ranitidine (ZANTAC) 150 MG capsule Take 1 capsule (150 mg total) by mouth 2 (two) times daily. 30 capsule 0   Social History   Socioeconomic History  . Marital status: Unknown    Spouse name: Not on file  . Number of children: Not on file  . Years of education: Not on file  . Highest education level: Not on file  Occupational History  . Not on file  Social Needs  . Financial resource strain: Not on file  . Food  insecurity:    Worry: Not on file    Inability: Not on file  . Transportation needs:    Medical: Not on file    Non-medical: Not on file  Tobacco Use  . Smoking status: Never Smoker  Substance and Sexual Activity  . Alcohol use: Yes    Comment: occ   . Drug use: No  . Sexual activity: Not on file  Lifestyle  . Physical activity:    Days per week: Not on file    Minutes per session: Not on file  . Stress: Not on file  Relationships  . Social connections:    Talks on phone: Not on file    Gets together: Not on file    Attends religious service: Not on file    Active member of club or organization: Not on file    Attends meetings of clubs or organizations: Not on file    Relationship status: Not on file  . Intimate partner violence:    Fear of current or ex partner: Not on file    Emotionally abused: Not on file    Physically abused: Not on file    Forced sexual activity: Not on file  Other Topics Concern  . Not on file  Social History Narrative  . Not on file   History reviewed. No pertinent family history.  OBJECTIVE:  Vitals:   09/14/18 0844 09/14/18 0936  BP: (!) 134/100   Pulse: (!) 111 Marland Kitchen)  115  Resp: 18 20  Temp: (!) 102.8 F (39.3 C) (!) 102.8 F (39.3 C)  TempSrc:  Oral  SpO2: 96% 100%     General appearance: alert; appears acutely ill HEENT: NCAT; Ears: EACs clear, TMs pearly gray; Eyes: PERRL.  EOM grossly intact.  Nose: no rhinorrhea; tonsils nonerythematous, uvula midline Neck: supple without LAD Lungs: CTAB Heart: Tachycardia.  Radial pulses 2+ symmetrical bilaterally Abdomen: soft, nondistended, normal active bowel sounds; nontender to palpation; no guarding Back: no CVA tenderness Skin: warm and dry Psychological: alert and cooperative; normal mood and affect  Results for orders placed or performed during the hospital encounter of 09/14/18 (from the past 24 hour(s))  POCT urinalysis dip (device)     Status: Abnormal   Collection Time:  09/14/18  9:42 AM  Result Value Ref Range   Glucose, UA NEGATIVE NEGATIVE mg/dL   Bilirubin Urine SMALL (A) NEGATIVE   Ketones, ur 40 (A) NEGATIVE mg/dL   Specific Gravity, Urine >=1.030 1.005 - 1.030   Hgb urine dipstick MODERATE (A) NEGATIVE   pH 6.0 5.0 - 8.0   Protein, ur 100 (A) NEGATIVE mg/dL   Urobilinogen, UA 0.2 0.0 - 1.0 mg/dL   Nitrite NEGATIVE NEGATIVE   Leukocytes, UA NEGATIVE NEGATIVE  CBC with Differential     Status: Abnormal   Collection Time: 09/14/18  9:49 AM  Result Value Ref Range   WBC 10.5 4.0 - 10.5 K/uL   RBC 4.45 3.87 - 5.11 MIL/uL   Hemoglobin 12.6 12.0 - 15.0 g/dL   HCT 39.2 36.0 - 46.0 %   MCV 88.1 80.0 - 100.0 fL   MCH 28.3 26.0 - 34.0 pg   MCHC 32.1 30.0 - 36.0 g/dL   RDW 14.0 11.5 - 15.5 %   Platelets 194 150 - 400 K/uL   nRBC 0.0 0.0 - 0.2 %   Neutrophils Relative % 81 %   Neutro Abs 8.5 (H) 1.7 - 7.7 K/uL   Lymphocytes Relative 8 %   Lymphs Abs 0.8 0.7 - 4.0 K/uL   Monocytes Relative 11 %   Monocytes Absolute 1.2 (H) 0.1 - 1.0 K/uL   Eosinophils Relative 0 %   Eosinophils Absolute 0.0 0.0 - 0.5 K/uL   Basophils Relative 0 %   Basophils Absolute 0.0 0.0 - 0.1 K/uL   Immature Granulocytes 0 %   Abs Immature Granulocytes 0.04 0.00 - 0.07 K/uL   Polychromasia PRESENT     ASSESSMENT & PLAN:  1. Fever, unspecified   2. Tachycardia    Reviewed blood work with Dr. Mannie Stabile.  No concerning findings today.  Patient instructed to follow up with PCP for reevaluation next week.    Meds ordered this encounter  Medications  . acetaminophen (TYLENOL) tablet 650 mg  . doxycycline (VIBRAMYCIN) 100 MG capsule    Sig: Take 1 capsule (100 mg total) by mouth 2 (two) times daily.    Dispense:  20 capsule    Refill:  0    Order Specific Question:   Supervising Provider    Answer:   Wynona Luna [[id number]]    Urine did not show signs of urinary tract infection Blood work obtained.  We will follow up with you regarding the results We will start  you on doxycycline for empiric treatment of tick borne illness Continue to alternate ibuprofen and tylenol every 6-8 hours Please schedule appointment with PCP this week or next week for reevaluation Return or go to the ED if you have any  new or worsening symptoms such as fever that does not moderate with tylenol or ibuprofen, cough, abdominal pain, vomiting, diarrhea, neck pain, severe headache, rash, etc...  Reviewed expectations re: course of current medical issues. Questions answered. Outlined signs and symptoms indicating need for more acute intervention. Patient verbalized understanding. After Visit Summary given.          Lestine Box, PA-C 09/14/18 [place], [place], PA-C 09/14/18 1657

## 2018-09-14 NOTE — ED Triage Notes (Signed)
3 day history of fever, really achy, headache, denies cough, runny nose or stuffiness

## 2018-09-15 ENCOUNTER — Ambulatory Visit (HOSPITAL_COMMUNITY)
Admission: EM | Admit: 2018-09-15 | Discharge: 2018-09-15 | Disposition: A | Discharge status: Home / Self Care | Payer: 59 | Attending: Family Medicine | Admitting: Family Medicine

## 2018-09-15 ENCOUNTER — Encounter (HOSPITAL_COMMUNITY): Payer: Self-pay | Admitting: Emergency Medicine

## 2018-09-15 DIAGNOSIS — R059 Cough, unspecified: Secondary | ICD-10-CM

## 2018-09-15 DIAGNOSIS — R509 Fever, unspecified: Secondary | ICD-10-CM

## 2018-09-15 DIAGNOSIS — R05 Cough: Secondary | ICD-10-CM

## 2018-09-15 MED ORDER — ALBUTEROL SULFATE HFA 108 (90 BASE) MCG/ACT IN AERS
2.0000 | INHALATION_SPRAY | Freq: Once | RESPIRATORY_TRACT | Status: AC
  Administered 2018-09-15: 2 via RESPIRATORY_TRACT

## 2018-09-15 MED ORDER — ALBUTEROL SULFATE HFA 108 (90 BASE) MCG/ACT IN AERS
INHALATION_SPRAY | RESPIRATORY_TRACT | Status: AC
  Filled 2018-09-15: qty 6.7

## 2018-09-15 MED ORDER — BENZONATATE 100 MG PO CAPS: 100.0000 mg | ORAL_CAPSULE | Freq: Three times a day (TID) | ORAL | 0 refills | Status: DC

## 2018-09-15 MED FILL — BENZONATATE 100 MG CAPS: 100 | 7 days supply | Qty: 21 | Fill #0

## 2018-09-15 NOTE — ED Triage Notes (Signed)
Pt was seen here yesterday for fever, was given antibiotics for tick bite. Pt states she now is coughing and is having diarrhea.

## 2018-09-15 NOTE — ED Provider Notes (Signed)
[facility]   [id number] 09/15/18 Arrival Time: 1115  CC: Cough and diarrhea  SUBJECTIVE:  Dana Campbell is a 37 y.o. female who presents with persistent dry cough and 4 episodes of looser stools that began yesterday.  She was seen yesterday for fever and treated with doxycycline for potential tick born illness.  Patient has taken three doses of the doxycycline.  Symptoms are made worse with lying down at night.  Denies previous symptoms in the past. Complains of associated fever 100.3 this am, chills, fatigue, and decreased appetite.  Denies sinus pain, rhinorrhea, sore throat, SOB, wheezing, chest pain, nausea, changes in bowel or bladder habits.    ROS: As per HPI.  History reviewed. No pertinent past medical history. History reviewed. No pertinent surgical history. Allergies  Allergen Reactions  . Cinnamon Itching   No current facility-administered medications on file prior to encounter.    Current Outpatient Medications on File Prior to Encounter  Medication Sig Dispense Refill  . acetaminophen (TYLENOL) 325 MG tablet Take 650 mg by mouth every 6 (six) hours as needed.    . doxycycline (VIBRAMYCIN) 100 MG capsule Take 1 capsule (100 mg total) by mouth 2 (two) times daily. 20 capsule 0  . meclizine (ANTIVERT) 25 MG tablet Take 1 tablet (25 mg total) by mouth 3 (three) times daily as needed for dizziness. 30 tablet 0  . ranitidine (ZANTAC) 150 MG capsule Take 1 capsule (150 mg total) by mouth 2 (two) times daily. 30 capsule 0    Social History   Socioeconomic History  . Marital status: Unknown    Spouse name: Not on file  . Number of children: Not on file  . Years of education: Not on file  . Highest education level: Not on file  Occupational History  . Not on file  Social Needs  . Financial resource strain: Not on file  . Food insecurity:    Worry: Not on file    Inability: Not on file  . Transportation needs:    Medical: Not on file    Non-medical: Not  on file  Tobacco Use  . Smoking status: Never Smoker  Substance and Sexual Activity  . Alcohol use: Yes    Comment: occ   . Drug use: No  . Sexual activity: Not on file  Lifestyle  . Physical activity:    Days per week: Not on file    Minutes per session: Not on file  . Stress: Not on file  Relationships  . Social connections:    Talks on phone: Not on file    Gets together: Not on file    Attends religious service: Not on file    Active member of club or organization: Not on file    Attends meetings of clubs or organizations: Not on file    Relationship status: Not on file  . Intimate partner violence:    Fear of current or ex partner: Not on file    Emotionally abused: Not on file    Physically abused: Not on file    Forced sexual activity: Not on file  Other Topics Concern  . Not on file  Social History Narrative  . Not on file   No family history on file.   OBJECTIVE:  Vitals:   09/15/18 1159  BP: 115/75  Pulse: 91  Resp: 20  Temp: 99.6 F (37.6 C)  SpO2: 100%     General appearance: Alert; appears fatigue, but nontoxic; appears improved from  yesterday HEENT: NCAT; PERRL.  EOM grossly intact.  No clear rhinorrhea; tonsils nonerythematous, torus palatinus hard palate, uvula midline Neck: supple without LAD Lungs: clear to auscultation bilaterally without adventitious breath sounds; moderate dry cough present Heart: regular rate and rhythm.  Radial pulses 2+ symmetrical bilaterally Skin: warm and dry Psychological: alert and cooperative; normal mood and affect  Results for orders placed or performed during the hospital encounter of 09/14/18  CBC with Differential  Result Value Ref Range   WBC 10.5 4.0 - 10.5 K/uL   RBC 4.45 3.87 - 5.11 MIL/uL   Hemoglobin 12.6 12.0 - 15.0 g/dL   HCT 39.2 36.0 - 46.0 %   MCV 88.1 80.0 - 100.0 fL   MCH 28.3 26.0 - 34.0 pg   MCHC 32.1 30.0 - 36.0 g/dL   RDW 14.0 11.5 - 15.5 %   Platelets 194 150 - 400 K/uL   nRBC 0.0  0.0 - 0.2 %   Neutrophils Relative % 81 %   Neutro Abs 8.5 (H) 1.7 - 7.7 K/uL   Lymphocytes Relative 8 %   Lymphs Abs 0.8 0.7 - 4.0 K/uL   Monocytes Relative 11 %   Monocytes Absolute 1.2 (H) 0.1 - 1.0 K/uL   Eosinophils Relative 0 %   Eosinophils Absolute 0.0 0.0 - 0.5 K/uL   Basophils Relative 0 %   Basophils Absolute 0.0 0.0 - 0.1 K/uL   Immature Granulocytes 0 %   Abs Immature Granulocytes 0.04 0.00 - 0.07 K/uL   Polychromasia PRESENT   POCT urinalysis dip (device)  Result Value Ref Range   Glucose, UA NEGATIVE NEGATIVE mg/dL   Bilirubin Urine SMALL (A) NEGATIVE   Ketones, ur 40 (A) NEGATIVE mg/dL   Specific Gravity, Urine >=1.030 1.005 - 1.030   Hgb urine dipstick MODERATE (A) NEGATIVE   pH 6.0 5.0 - 8.0   Protein, ur 100 (A) NEGATIVE mg/dL   Urobilinogen, UA 0.2 0.0 - 1.0 mg/dL   Nitrite NEGATIVE NEGATIVE   Leukocytes, UA NEGATIVE NEGATIVE    ASSESSMENT & PLAN:  1. Fever, unspecified   2. Cough     Meds ordered this encounter  Medications  . benzonatate (TESSALON) 100 MG capsule    Sig: Take 1 capsule (100 mg total) by mouth every 8 (eight) hours.    Dispense:  21 capsule    Refill:  0    Order Specific Question:   Supervising Provider    Answer:   Wynona Luna [phone]  . albuterol (PROVENTIL HFA;VENTOLIN HFA) 108 (90 Base) MCG/ACT inhaler 2 puff   Continue with doxycyline Tessalon perles prescribed.  Take as needed for cough.   Inhaler given in office.  Use as needed  Get plenty of rest and push fluids Continue to alternate ibuprofen and tylenol for fever and body aches Return or go to ER if you have any new or worsening symptoms such as fever that does not moderate with tylenol or ibuprofen, cough, abdominal pain, vomiting, profuse watery diarrhea, neck pain, severe headache, rash, etc...  Reviewed expectations re: course of current medical issues. Questions answered. Outlined signs and symptoms indicating need for more acute intervention. Patient  verbalized understanding. After Visit Summary given.          Lestine Box, PA-C 09/15/18 1354

## 2018-09-15 NOTE — Discharge Instructions (Signed)
Continue with doxycyline Tessalone perles prescribed.  Take as needed for cough.   Inhaler given in office.  Use as needed  Get plenty of rest and push fluids Continue to alternate ibuprofen and tylenol for fever and body aches Return or go to ER if you have any new or worsening symptoms such as fever that does not moderate with tylenol or ibuprofen, cough, abdominal pain, vomiting, profuse watery diarrhea, neck pain, severe headache, rash, etc..Marland Kitchen

## 2019-11-30 ENCOUNTER — Encounter (HOSPITAL_COMMUNITY): Payer: Self-pay | Admitting: Emergency Medicine

## 2019-11-30 ENCOUNTER — Emergency Department (HOSPITAL_COMMUNITY)
Admission: EM | Admit: 2019-11-30 | Discharge: 2019-12-01 | Disposition: A | Discharge status: Home / Self Care | Payer: 59 | Attending: Emergency Medicine | Admitting: Emergency Medicine

## 2019-11-30 DIAGNOSIS — E669 Obesity, unspecified: Secondary | ICD-10-CM | POA: Insufficient documentation

## 2019-11-30 DIAGNOSIS — D259 Leiomyoma of uterus, unspecified: Secondary | ICD-10-CM | POA: Insufficient documentation

## 2019-11-30 DIAGNOSIS — R1033 Periumbilical pain: Secondary | ICD-10-CM | POA: Diagnosis present

## 2019-11-30 DIAGNOSIS — Z6841 Body Mass Index (BMI) 40.0 and over, adult: Secondary | ICD-10-CM | POA: Diagnosis not present

## 2019-11-30 DIAGNOSIS — K429 Umbilical hernia without obstruction or gangrene: Secondary | ICD-10-CM | POA: Insufficient documentation

## 2019-11-30 LAB — URINALYSIS, ROUTINE W REFLEX MICROSCOPIC
Bacteria, UA: NONE SEEN
Bilirubin Urine: NEGATIVE
Glucose, UA: NEGATIVE mg/dL
Ketones, ur: 20 mg/dL — AB
Leukocytes,Ua: NEGATIVE
Nitrite: NEGATIVE
Protein, ur: 30 mg/dL — AB
Specific Gravity, Urine: 1.025 (ref 1.005–1.030)
pH: 5 (ref 5.0–8.0)

## 2019-11-30 LAB — COMPREHENSIVE METABOLIC PANEL
ALT: 16 U/L (ref 0–44)
AST: 22 U/L (ref 15–41)
Albumin: 3.9 g/dL (ref 3.5–5.0)
Alkaline Phosphatase: 42 U/L (ref 38–126)
Anion gap: 9 (ref 5–15)
BUN: 7 mg/dL (ref 6–20)
CO2: 24 mmol/L (ref 22–32)
Calcium: 10.1 mg/dL (ref 8.9–10.3)
Chloride: 104 mmol/L (ref 98–111)
Creatinine, Ser: 0.81 mg/dL (ref 0.44–1.00)
GFR calc Af Amer: >60 mL/min (ref >60)
GFR calc non Af Amer: >60 mL/min (ref >60)
Glucose, Bld: 89 mg/dL (ref 70–99)
Potassium: 4.1 mmol/L (ref 3.5–5.1)
Sodium: 137 mmol/L (ref 135–145)
Total Bilirubin: 0.6 mg/dL (ref 0.3–1.2)
Total Protein: 6.9 g/dL (ref 6.5–8.1)

## 2019-11-30 LAB — CBC
HCT: 39.3 % (ref 36.0–46.0)
Hemoglobin: 12.2 g/dL (ref 12.0–15.0)
MCH: 27.8 pg (ref 26.0–34.0)
MCHC: 31 g/dL (ref 30.0–36.0)
MCV: 89.5 fL (ref 80.0–100.0)
Platelets: 245 10*3/uL (ref 150–400)
RBC: 4.39 MIL/uL (ref 3.87–5.11)
RDW: 14.6 % (ref 11.5–15.5)
WBC: 7.7 10*3/uL (ref 4.0–10.5)
nRBC: 0 % (ref 0.0–0.2)

## 2019-11-30 LAB — LIPASE, BLOOD: Lipase: 23 U/L (ref 11–51)

## 2019-11-30 LAB — I-STAT BETA HCG BLOOD, ED (MC, WL, AP ONLY): I-stat hCG, quantitative: <5 m[IU]/mL (ref <5)

## 2019-11-30 MED ORDER — SODIUM CHLORIDE 0.9% FLUSH: 3.0000 mL | Freq: Once | INTRAVENOUS | Status: DC

## 2019-11-30 NOTE — ED Triage Notes (Signed)
Pt. Stated, IVE HAD STOMACH PAIN SINCE Tuesday AFTERNOON. ITS OFF AND ON.

## 2019-12-01 ENCOUNTER — Emergency Department (HOSPITAL_COMMUNITY): Payer: 59

## 2019-12-01 ENCOUNTER — Other Ambulatory Visit: Payer: Self-pay

## 2019-12-01 DIAGNOSIS — E669 Obesity, unspecified: Secondary | ICD-10-CM | POA: Diagnosis not present

## 2019-12-01 DIAGNOSIS — Z6841 Body Mass Index (BMI) 40.0 and over, adult: Secondary | ICD-10-CM | POA: Diagnosis not present

## 2019-12-01 DIAGNOSIS — R109 Unspecified abdominal pain: Secondary | ICD-10-CM | POA: Diagnosis not present

## 2019-12-01 DIAGNOSIS — K429 Umbilical hernia without obstruction or gangrene: Secondary | ICD-10-CM | POA: Diagnosis not present

## 2019-12-01 DIAGNOSIS — D259 Leiomyoma of uterus, unspecified: Secondary | ICD-10-CM | POA: Diagnosis not present

## 2019-12-01 DIAGNOSIS — K420 Umbilical hernia with obstruction, without gangrene: Secondary | ICD-10-CM | POA: Diagnosis not present

## 2019-12-01 MED ORDER — MORPHINE SULFATE (PF) 4 MG/ML IV SOLN
4.0000 mg | Freq: Once | INTRAVENOUS | Status: AC
  Administered 2019-12-01: 4 mg via INTRAVENOUS
  Filled 2019-12-01: qty 1

## 2019-12-01 MED ORDER — IOHEXOL 300 MG/ML  SOLN
100.0000 mL | Freq: Once | INTRAMUSCULAR | Status: AC | PRN
  Administered 2019-12-01: 10:00:00 100 mL via INTRAVENOUS

## 2019-12-01 NOTE — ED Notes (Signed)
Patient verbalizes understanding of discharge instructions. Opportunity for questioning and answers were provided. Armband removed by staff, pt discharged from ED. Ambulatory by self

## 2019-12-01 NOTE — Discharge Instructions (Signed)
You are seen in there for abdominal pain  CT showed a small umbilical hernia.  General surgery evaluated you here and recommended discharge with treatment for pain.  You can alternate ibuprofen or acetaminophen as needed every 6-8 hours for pain.  Avoid any rubbing or tight clothing that can cause more discomfort.  Follow-up with general surgery clinic as needed for further discussion of elective and outpatient management of this.  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  Fibroids can cause heavy or prolonged menstrual cycles, pelvic pain with menstrual cycles.  No emergent or immediate need to manage or treat this here in the ER but you should follow-up with OB/GYN for further discussion of this.

## 2019-12-01 NOTE — ED Provider Notes (Signed)
[facility] EMERGENCY DEPARTMENT Provider Note   CSN: [id number] Arrival date & time: 11/30/19  1659     History Chief Complaint  Patient presents with  . Abdominal Pain    Dana Campbell is a 39 y.o. female presents to the ER for evaluation of abdominal pain.  Onset Tuesday as she was standing up from her desk.  Pain is very local around the bellybutton, nonradiating.  It is intermittent.  Initially the pain was constant now mostly notices if she lays a certain way specifically laying on her sides, bends down.  The pain is better if she lays on her back or does not move.  Her pant waist also seems to be rubbing on the area causing more discomfort.  Has taken Tylenol with minimal improvement of pain.  Initially did not seek evaluation because she thought the pain was due to exercising.  The day before and day of onset of pain she had done an ab workout.  The day of the onset of pain was also day one of her menses which occasionally gives her abdominal pain.  Denies any other symptoms.  No associated fever, nausea or vomiting.  Has had increased bowel movements that she attributes to her menses but no frank diarrhea.  No constipation.  No dysuria, hematuria. No abdominal surgeries.  No history of kidney stones or hernias. No history of pelvic issues like ovarian cysts, fibroids, endometriosis.   HPI     History reviewed. No pertinent past medical history.  There are no problems to display for this patient.   History reviewed. No pertinent surgical history.   OB History   No obstetric history on file.     No family history on file.  Social History   Tobacco Use  . Smoking status: Never Smoker  . Smokeless tobacco: Never Used  Substance Use Topics  . Alcohol use: Yes    Comment: occ   . Drug use: No    Home Medications Prior to Admission medications   Medication Sig Start Date End Date Taking? Authorizing Provider  acetaminophen (TYLENOL) 325 MG  tablet Take 650 mg by mouth every 6 (six) hours as needed.    [provider]  benzonatate (TESSALON) 100 MG capsule Take 1 capsule (100 mg total) by mouth every 8 (eight) hours. 09/15/18   Wurst, [place], PA-C  doxycycline (VIBRAMYCIN) 100 MG capsule Take 1 capsule (100 mg total) by mouth 2 (two) times daily. 09/14/18   Wurst, [place], PA-C  meclizine (ANTIVERT) 25 MG tablet Take 1 tablet (25 mg total) by mouth 3 (three) times daily as needed for dizziness. 09/06/14   Harden Mo, MD  ranitidine (ZANTAC) 150 MG capsule Take 1 capsule (150 mg total) by mouth 2 (two) times daily. 05/14/12 05/14/13  Melynda Ripple, MD    Allergies    Cinnamon  Review of Systems   Review of Systems  Gastrointestinal: Positive for abdominal pain.  All other systems reviewed and are negative.   Physical Exam Updated Vital Signs BP 131/77 (BP Location: Left Arm)   Pulse 95   Temp 98.4 F (36.9 C) (Oral)   Resp 18   Ht 5\' 4"  (1.626 m)   Wt (!) 137.4 kg   LMP 11/27/2019   SpO2 98%   BMI 52.01 kg/m   Physical Exam Vitals and nursing note reviewed.  Constitutional:      Appearance: She is well-developed.     Comments: Non toxic in NAD  HENT:  Head: Normocephalic and atraumatic.     Nose: Nose normal.  Eyes:     Conjunctiva/sclera: Conjunctivae normal.  Cardiovascular:     Rate and Rhythm: Normal rate and regular rhythm.  Pulmonary:     Effort: Pulmonary effort is normal.     Breath sounds: Normal breath sounds.  Abdominal:     General: Bowel sounds are normal.     Palpations: Abdomen is soft.     Tenderness: There is abdominal tenderness in the periumbilical area.     Hernia: A hernia (?) is present.     Comments: Obese soft abdomen. Grape sized firm nodule that is slightly mobile vs hernia at umbilicus, tender.  Patient states she has never noticed this before.  Firm pressure/hold causes pain, unable to be reduced.  Skin normal. No other abd tenderness. Negative Murphy's and  McBurney's.  No suprapubic or CVA tenderness. Unable to auscultate BS difficult due to body habitus   Musculoskeletal:        General: Normal range of motion.     Cervical back: Normal range of motion.  Skin:    General: Skin is warm and dry.     Capillary Refill: Capillary refill takes less than 2 seconds.  Neurological:     Mental Status: She is alert.  Psychiatric:        Behavior: Behavior normal.     ED Results / Procedures / Treatments   Labs (all labs ordered are listed, but only abnormal results are displayed) Labs Reviewed  URINALYSIS, ROUTINE W REFLEX MICROSCOPIC - Abnormal; Notable for the following components:      Result Value   APPearance HAZY (*)    Hgb urine dipstick LARGE (*)    Ketones, ur 20 (*)    Protein, ur 30 (*)    All other components within normal limits  LIPASE, BLOOD  COMPREHENSIVE METABOLIC PANEL  CBC  I-STAT BETA HCG BLOOD, ED ([facility], [facility], [facility] ONLY)    EKG None  Radiology CT ABDOMEN PELVIS W CONTRAST  Result Date: 12/01/2019 CLINICAL DATA:  Abdominal pain. Evaluate for hernia. EXAM: CT ABDOMEN AND PELVIS WITH CONTRAST TECHNIQUE: Multidetector CT imaging of the abdomen and pelvis was performed using the standard protocol following bolus administration of intravenous contrast. CONTRAST:  169mL OMNIPAQUE IOHEXOL 300 MG/ML  SOLN COMPARISON:  None FINDINGS: Lower chest: No acute abnormality. Hepatobiliary: No focal liver abnormality is seen. No gallstones, gallbladder wall thickening, or biliary dilatation. Pancreas: Unremarkable. No pancreatic ductal dilatation or surrounding inflammatory changes. Spleen: Normal in size without focal abnormality. Adrenals/Urinary Tract: Normal appearance of the adrenal glands. The kidneys are unremarkable. No mass or hydronephrosis identified. Urinary bladder is unremarkable. Stomach/Bowel: Stomach is within normal limits. Appendix appears normal. No evidence of bowel wall thickening, distention, or inflammatory changes.  Vascular/Lymphatic: No significant vascular findings are present. No enlarged abdominal or pelvic lymph nodes. Reproductive: Markedly enlarged uterus is identified which is felt to likely be secondary to underlying uterine fibroids. The uterus measures 19.8 by 7.5 by 17.7 cm (volume = 1400 cm^3). Multiple fibroids are identified. No adnexal mass noted. Other: 2.6 by 3.9 by 2.9 cm fat containing umbilical hernia is identified. There is a small amount of fluid attenuation and surrounding fat stranding. Cannot rule out incarceration. Musculoskeletal: No acute or significant osseous findings. IMPRESSION: 1. Small fat containing umbilical hernia is identified. There is a small amount of fluid is noted within the hernia as well as mild fat stranding. Cannot rule out incarceration. 2. Enlarged fibroid  uterus. Electronically Signed   By: Kerby Moors M.D.   On: 12/01/2019 10:32    Procedures Procedures (including critical care time)  Medications Ordered in ED Medications  sodium chloride flush (NS) 0.9 % injection 3 mL (has no administration in time range)  morphine 4 MG/ML injection 4 mg (4 mg Intravenous Given 12/01/19 0826)  iohexol (OMNIPAQUE) 300 MG/ML solution 100 mL (100 mLs Intravenous Contrast Given 12/01/19 1013)    ED Course  I have reviewed the triage vital signs and the nursing notes.  Pertinent labs & imaging results that were available during my care of the patient were reviewed by me and considered in my medical decision making (see chart for details).  Clinical Course as of Nov 30 1240  Fri Dec 01, 2019  1045 Reproductive: Markedly enlarged uterus is identified which is felt to likely be secondary to underlying uterine fibroids. The uterus measures 19.8 by 7.5 by 17.7 cm (volume = 1400 cm^3). Multiple fibroids are identified. No adnexal mass noted.  Other: 2.6 by 3.9 by 2.9 cm fat containing umbilical hernia is identified. There is a small amount of fluid attenuation and surrounding fat  stranding. Cannot rule out incarceration.   CT ABDOMEN PELVIS W CONTRAST [CG]  1125 Spoke to Will with general surgery, surgery will come see patient in ER    [CG]    Clinical Course User Index [CG] Kinnie Feil, PA-C   MDM Rules/Calculators/A&P                      Ddx includes abdominal muscle strain from recent exercise vs symptomatic umbilical hernia vs irritated abdominal nodule/skin mass.  Nodule is quite firm and slightly mobile however given recent exercise, body habitus and location could be hernia.  I am unable to reduce it and not sure if nodule vs hernia.    ER work up initiate in triage. Patient has been in ER waiting room 15+ hours.  Lab work normal. Hgb and RBCs in urine but she has her menstrual cycle and has no vaginal or urinary complaints. Considered GU/GYN process unlikely given overall clinical picture.   Will give morphine, apply ice, reattempt, discuss with EDP.  Consider CTAP.   1240: CTAP as above.  Pt evaluated by GSY in ER who recommends dc with symptomatic management. Pain improved here.   Final Clinical Impression(s) / ED Diagnoses Final diagnoses:  Umbilical hernia without obstruction and without gangrene  Uterine leiomyoma, unspecified location    Rx / DC Orders ED Discharge Orders    None       Kinnie Feil, PA-C 12/01/19 1242    Blanchie Dessert, MD 12/01/19 1920

## 2019-12-01 NOTE — ED Notes (Signed)
Patient transported to CT 

## 2019-12-01 NOTE — Consult Note (Signed)
[facility] Surgery Consult Note  Dana Campbell 1981/10/28  [id number].    Requesting MD: Carmon Sails PA  Chief Complaint: Abdominal pain  Reason for Consult: Umbilical hernia with incarcerated fat.   HPI:  Patient is a 39 year old female who developed abdominal pain on 11/28/2019 after doing some abdominal exercises. Pain is been around her bellybutton and nonradiating. Pain would come and go but has become more constant now. Degree of pain is based on her position. She is better on her back lying still. Her pants waist seems to be making the problem worse. Tylenol has been ineffective in resolving her pain. No nausea vomiting or fever. She has started her monthly mensis.  Work-up in the ED shows she is afebrile vital signs are stable. BMI is 51.9. CMP is completely normal, WBC 7.7, hemoglobin 12.2, hematocrit 39.3, platelets 245,000. Urinalysis is unremarkable. hCG is negative.  CT scan shows a small 2.6 x 3.9 x 2.5 cm fat-containing umbilical hernia there is also a small amount of fluid and surrounding fat stranding. She also has an large fibroid uterus. We are asked to see.   At time of consultation her pain has subsided a fair amount.  She is otherwise healthy.  She is working on weight loss and is already lost about 14 pounds, and also has intentions to establish care with a primary care provider and her gynecologist this year.   Review of systems is otherwise negative.  No family history on file.  History reviewed. No pertinent past medical history.  History reviewed. No pertinent surgical history.  Social History:  reports that she has never smoked. She has never used smokeless tobacco. She reports current alcohol use. She reports that she does not use drugs.  Allergies:  Allergies  Allergen Reactions  . Cinnamon Itching   Prior to Admission medications   Medication Sig Start Date End Date Taking? Authorizing Provider  acetaminophen (TYLENOL) 325 MG tablet Take  650 mg by mouth every 6 (six) hours as needed.    [provider]  benzonatate (TESSALON) 100 MG capsule Take 1 capsule (100 mg total) by mouth every 8 (eight) hours. 09/15/18   Wurst, [place], PA-C  doxycycline (VIBRAMYCIN) 100 MG capsule Take 1 capsule (100 mg total) by mouth 2 (two) times daily. 09/14/18   Wurst, [place], PA-C  meclizine (ANTIVERT) 25 MG tablet Take 1 tablet (25 mg total) by mouth 3 (three) times daily as needed for dizziness. 09/06/14   Harden Mo, MD  ranitidine (ZANTAC) 150 MG capsule Take 1 capsule (150 mg total) by mouth 2 (two) times daily. 05/14/12 05/14/13  Melynda Ripple, MD     Blood pressure 122/84, pulse 73, temperature 98.4 F (36.9 C), temperature source Oral, resp. rate 18, height 5\' 4"  (1.626 m), weight (!) 137.4 kg, last menstrual period 11/27/2019, SpO2 100 %. Physical Exam: Physical Exam  Results for orders placed or performed during the hospital encounter of 11/30/19 (from the past 48 hour(s))  Lipase, blood     Status: None   Collection Time: 11/30/19  6:12 PM  Result Value Ref Range   Lipase 23 11 - 51 U/L    Comment: Performed at [facility], [place] [address], [place], [place] [address]  Comprehensive metabolic panel     Status: None   Collection Time: 11/30/19  6:12 PM  Result Value Ref Range   Sodium 137 135 - 145 mmol/L   Potassium 4.1 3.5 - 5.1 mmol/L   Chloride 104 98 -  111 mmol/L   CO2 24 22 - 32 mmol/L   Glucose, Bld 89 70 - 99 mg/dL   BUN 7 6 - 20 mg/dL   Creatinine, Ser 0.81 0.44 - 1.00 mg/dL   Calcium 10.1 8.9 - 10.3 mg/dL   Total Protein 6.9 6.5 - 8.1 g/dL   Albumin 3.9 3.5 - 5.0 g/dL   AST 22 15 - 41 U/L   ALT 16 0 - 44 U/L   Alkaline Phosphatase 42 38 - 126 U/L   Total Bilirubin 0.6 0.3 - 1.2 mg/dL   GFR calc non Af Amer >60 >60 mL/min   GFR calc Af Amer >60 >60 mL/min   Anion gap 9 5 - 15    Comment: Performed at [facility], [place] [address], [place], [place] [address]  CBC     Status: None    Collection Time: 11/30/19  6:12 PM  Result Value Ref Range   WBC 7.7 4.0 - 10.5 K/uL   RBC 4.39 3.87 - 5.11 MIL/uL   Hemoglobin 12.2 12.0 - 15.0 g/dL   HCT 39.3 36.0 - 46.0 %   MCV 89.5 80.0 - 100.0 fL   MCH 27.8 26.0 - 34.0 pg   MCHC 31.0 30.0 - 36.0 g/dL   RDW 14.6 11.5 - 15.5 %   Platelets 245 150 - 400 K/uL   nRBC 0.0 0.0 - 0.2 %    Comment: Performed at [facility], [place] [address] [place], [place] [address]  I-Stat beta hCG blood, ED     Status: None   Collection Time: 11/30/19  6:16 PM  Result Value Ref Range   I-stat hCG, quantitative <5.0 <5 mIU/mL   Comment 3            Comment:   GEST. AGE      CONC.  (mIU/mL)   <=1 WEEK        5 - 50     2 WEEKS       50 - 500     3 WEEKS       100 - 10,000     4 WEEKS     1,000 - 30,000        FEMALE AND NON-PREGNANT FEMALE:     LESS THAN 5 mIU/mL   Urinalysis, Routine w reflex microscopic     Status: Abnormal   Collection Time: 11/30/19 10:04 PM  Result Value Ref Range   Color, Urine YELLOW YELLOW   APPearance HAZY (A) CLEAR   Specific Gravity, Urine 1.025 1.005 - 1.030   pH 5.0 5.0 - 8.0   Glucose, UA NEGATIVE NEGATIVE mg/dL   Hgb urine dipstick LARGE (A) NEGATIVE   Bilirubin Urine NEGATIVE NEGATIVE   Ketones, ur 20 (A) NEGATIVE mg/dL   Protein, ur 30 (A) NEGATIVE mg/dL   Nitrite NEGATIVE NEGATIVE   Leukocytes,Ua NEGATIVE NEGATIVE   RBC / HPF 11-20 0 - 5 RBC/hpf   WBC, UA 0-5 0 - 5 WBC/hpf   Bacteria, UA NONE SEEN NONE SEEN   Squamous Epithelial / LPF 0-5 0 - 5   Mucus PRESENT     Comment: Performed at [facility], [place] [address] [place], [place] [address]   CT ABDOMEN PELVIS W CONTRAST  Result Date: 12/01/2019 CLINICAL DATA:  Abdominal pain. Evaluate for hernia. EXAM: CT ABDOMEN AND PELVIS WITH CONTRAST TECHNIQUE: Multidetector CT imaging of the abdomen and pelvis was performed using the standard protocol following bolus administration of intravenous  contrast. CONTRAST:  166mL OMNIPAQUE IOHEXOL 300 MG/ML   SOLN COMPARISON:  None FINDINGS: Lower chest: No acute abnormality. Hepatobiliary: No focal liver abnormality is seen. No gallstones, gallbladder wall thickening, or biliary dilatation. Pancreas: Unremarkable. No pancreatic ductal dilatation or surrounding inflammatory changes. Spleen: Normal in size without focal abnormality. Adrenals/Urinary Tract: Normal appearance of the adrenal glands. The kidneys are unremarkable. No mass or hydronephrosis identified. Urinary bladder is unremarkable. Stomach/Bowel: Stomach is within normal limits. Appendix appears normal. No evidence of bowel wall thickening, distention, or inflammatory changes. Vascular/Lymphatic: No significant vascular findings are present. No enlarged abdominal or pelvic lymph nodes. Reproductive: Markedly enlarged uterus is identified which is felt to likely be secondary to underlying uterine fibroids. The uterus measures 19.8 by 7.5 by 17.7 cm (volume = 1400 cm^3). Multiple fibroids are identified. No adnexal mass noted. Other: 2.6 by 3.9 by 2.9 cm fat containing umbilical hernia is identified. There is a small amount of fluid attenuation and surrounding fat stranding. Cannot rule out incarceration. Musculoskeletal: No acute or significant osseous findings. IMPRESSION: 1. Small fat containing umbilical hernia is identified. There is a small amount of fluid is noted within the hernia as well as mild fat stranding. Cannot rule out incarceration. 2. Enlarged fibroid uterus. Electronically Signed   By: Kerby Moors M.D.   On: 12/01/2019 10:32      Assessment/Plan  39 year old woman with small umbilical hernia containing incarcerated fat.  Large fibroid uterus.  No indication for urgent surgical intervention. I encouraged her to continue her weight loss efforts and would recommend deferring hernia repair until she has achieved a BMI less than 35 to minimize risk of recurrence.  I discussed with her the anatomy and etiology of the hernia and expect  that the pain she is experiencing will subside further with time.  She is welcome to follow-up with [facility] surgery at her convenience.     [facility] 12/01/2019, 12:21 PM Please see Amion for pager number during day hours 7:00am-4:30pm

## 2020-03-25 ENCOUNTER — Other Ambulatory Visit: Payer: Self-pay

## 2020-03-25 ENCOUNTER — Ambulatory Visit (HOSPITAL_COMMUNITY)
Admission: EM | Admit: 2020-03-25 | Discharge: 2020-03-25 | Disposition: A | Discharge status: Home / Self Care | Payer: 59 | Attending: Family Medicine | Admitting: Family Medicine

## 2020-03-25 ENCOUNTER — Encounter (HOSPITAL_COMMUNITY): Payer: Self-pay | Admitting: Family Medicine

## 2020-03-25 DIAGNOSIS — H1032 Unspecified acute conjunctivitis, left eye: Secondary | ICD-10-CM | POA: Diagnosis not present

## 2020-03-25 MED ORDER — CETIRIZINE HCL 10 MG PO TABS: 10.0000 mg | ORAL_TABLET | Freq: Every day | ORAL | 0 refills | Status: DC

## 2020-03-25 MED ORDER — OLOPATADINE HCL 0.2 % OP SOLN: 1.0000 [drp] | Freq: Every day | OPHTHALMIC | 0 refills | Status: DC

## 2020-03-25 MED FILL — OLOPATADINE HCL 0.2 % SOLN: 0.2 | 25 days supply | Qty: 3 | Fill #0

## 2020-03-25 NOTE — ED Triage Notes (Signed)
Pt c/o L eye pain, swelling and discharge x 2 days.

## 2020-03-25 NOTE — ED Provider Notes (Signed)
[facility]    CSN: [id number] Arrival date & time: 03/25/20  0820      History   Chief Complaint Chief Complaint  Patient presents with  . Eye Pain    HPI Dana Campbell is a 39 y.o. female.   Pt is a 39 year old female the presents today with left eye irritation, lid swelling, clear discharge.  Symptoms been constant since yesterday.  Denies any injuries or foreign bodies to the eye.  Patient does wear contacts but does not have the memory now.  Denies any vision changes did have mild blurred vision yesterday due to all the discharge and running in the eye.  No associated nasal congestion, rhinorrhea, fever, ear pain or sore throat.  She did use some old eyedrops that were previously prescribed for pinkeye.  Reporting some relief with this drop.  ROS per HPI      History reviewed. No pertinent past medical history.  There are no problems to display for this patient.   History reviewed. No pertinent surgical history.  OB History   No obstetric history on file.      Home Medications    Prior to Admission medications   Medication Sig Start Date End Date Taking? Authorizing Provider  acetaminophen (TYLENOL) 325 MG tablet Take 650 mg by mouth every 6 (six) hours as needed.    [provider]  benzonatate (TESSALON) 100 MG capsule Take 1 capsule (100 mg total) by mouth every 8 (eight) hours. 09/15/18   Wurst, [place], PA-C  cetirizine (ZYRTEC) 10 MG tablet Take 1 tablet (10 mg total) by mouth daily. 03/25/20   Loura Halt A, NP  doxycycline (VIBRAMYCIN) 100 MG capsule Take 1 capsule (100 mg total) by mouth 2 (two) times daily. 09/14/18   Wurst, [place], PA-C  meclizine (ANTIVERT) 25 MG tablet Take 1 tablet (25 mg total) by mouth 3 (three) times daily as needed for dizziness. 09/06/14   Harden Mo, MD  Olopatadine HCl 0.2 % SOLN Apply 1 drop to eye daily. 03/25/20   Loura Halt A, NP  ranitidine (ZANTAC) 150 MG capsule Take 1 capsule (150 mg  total) by mouth 2 (two) times daily. 05/14/12 05/14/13  Melynda Ripple, MD    Family History History reviewed. No pertinent family history.  Social History Social History   Tobacco Use  . Smoking status: Never Smoker  . Smokeless tobacco: Never Used  Substance Use Topics  . Alcohol use: Yes    Comment: occ   . Drug use: No     Allergies   Cinnamon   Review of Systems Review of Systems   Physical Exam Triage Vital Signs ED Triage Vitals  Enc Vitals Group     BP 03/25/20 0904 (!) 151/74     Pulse Rate 03/25/20 0904 90     Resp 03/25/20 0904 16     Temp 03/25/20 0904 98 F (36.7 C)     Temp src --      SpO2 03/25/20 0904 100 %     Weight --      Height --      Head Circumference --      Peak Flow --      Pain Score 03/25/20 0905 5     Pain Loc --      Pain Edu? --      Excl. in [place]? --    No data found.  Updated Vital Signs BP (!) 151/74   Pulse 90   Temp  98 F (36.7 C)   Resp 16   LMP  (LMP Unknown)   SpO2 100%   Visual Acuity Right Eye Distance:   Left Eye Distance:   Bilateral Distance:    Right Eye Near:   Left Eye Near:    Bilateral Near:     Physical Exam Vitals and nursing note reviewed.  Constitutional:      General: She is not in acute distress.    Appearance: Normal appearance. She is not ill-appearing, toxic-appearing or diaphoretic.  HENT:     Head: Normocephalic.     Nose: Nose normal.  Eyes:     General:        Left eye: Discharge present.    Extraocular Movements: Extraocular movements intact.     Conjunctiva/sclera: Conjunctivae normal.     Pupils: Pupils are equal, round, and reactive to light.     Comments: Left upper and lower lid swelling with tearing from eye.  Clear discharge. Mild scleral injection No orbital pain or pain with EOM  Pulmonary:     Effort: Pulmonary effort is normal.  Musculoskeletal:        General: Normal range of motion.     Cervical back: Normal range of motion.  Skin:    General: Skin is  warm and dry.     Findings: No rash.  Neurological:     Mental Status: She is alert.  Psychiatric:        Mood and Affect: Mood normal.      UC Treatments / Results  Labs (all labs ordered are listed, but only abnormal results are displayed) Labs Reviewed - No data to display  EKG   Radiology No results found.  Procedures Procedures (including critical care time)  Medications Ordered in UC Medications - No data to display  Initial Impression / Assessment and Plan / UC Course  I have reviewed the triage vital signs and the nursing notes.  Pertinent labs & imaging results that were available during my care of the patient were reviewed by me and considered in my medical decision making (see chart for details).     Allergic conjunctivitis Treating with Pataday eyedrops and Zyrtec daily Follow up as needed for continued or worsening symptoms  Final Clinical Impressions(s) / UC Diagnoses   Final diagnoses:  Acute conjunctivitis of left eye, unspecified acute conjunctivitis type     Discharge Instructions     Believe this is allergy related.  Take the medication as prescribed daily. Follow up as needed for continued or worsening symptoms     ED Prescriptions    Medication Sig Dispense Auth. Provider   Olopatadine HCl 0.2 % SOLN Apply 1 drop to eye daily. 2.5 mL Kiwan Gadsden A, NP   cetirizine (ZYRTEC) 10 MG tablet Take 1 tablet (10 mg total) by mouth daily. 30 tablet Loura Halt A, NP     PDMP not reviewed this encounter.   Orvan July, NP 03/25/20 [phone]

## 2020-03-25 NOTE — Discharge Instructions (Addendum)
Believe this is allergy related.  Take the medication as prescribed daily. Follow up as needed for continued or worsening symptoms

## 2020-09-09 ENCOUNTER — Other Ambulatory Visit (HOSPITAL_COMMUNITY): Payer: Self-pay | Admitting: Obstetrics & Gynecology

## 2020-09-09 DIAGNOSIS — Z6841 Body Mass Index (BMI) 40.0 and over, adult: Secondary | ICD-10-CM | POA: Diagnosis not present

## 2020-09-09 DIAGNOSIS — Z124 Encounter for screening for malignant neoplasm of cervix: Secondary | ICD-10-CM | POA: Diagnosis not present

## 2020-09-09 DIAGNOSIS — Z01411 Encounter for gynecological examination (general) (routine) with abnormal findings: Secondary | ICD-10-CM | POA: Diagnosis not present

## 2020-09-09 DIAGNOSIS — N920 Excessive and frequent menstruation with regular cycle: Secondary | ICD-10-CM | POA: Diagnosis not present

## 2020-09-09 DIAGNOSIS — D259 Leiomyoma of uterus, unspecified: Secondary | ICD-10-CM | POA: Diagnosis not present

## 2020-09-09 DIAGNOSIS — K429 Umbilical hernia without obstruction or gangrene: Secondary | ICD-10-CM | POA: Diagnosis not present

## 2020-09-10 MED FILL — TRANEXAMIC ACID 650 MG TAB: 650 | 5 days supply | Qty: 30 | Fill #0

## 2020-09-10 MED FILL — IBUPROFEN 800 MG TABS: 800 | 15 days supply | Qty: 30 | Fill #0

## 2020-09-27 MED FILL — IBUPROFEN 800 MG TABS: 800 | 10 days supply | Qty: 30 | Fill #0

## 2020-09-27 MED FILL — TRANEXAMIC ACID 650 MG TAB: 650 | 5 days supply | Qty: 30 | Fill #0

## 2020-09-30 DIAGNOSIS — D259 Leiomyoma of uterus, unspecified: Secondary | ICD-10-CM | POA: Diagnosis not present

## 2020-09-30 DIAGNOSIS — D252 Subserosal leiomyoma of uterus: Secondary | ICD-10-CM | POA: Diagnosis not present

## 2020-09-30 DIAGNOSIS — D251 Intramural leiomyoma of uterus: Secondary | ICD-10-CM | POA: Diagnosis not present

## 2020-09-30 DIAGNOSIS — N920 Excessive and frequent menstruation with regular cycle: Secondary | ICD-10-CM | POA: Diagnosis not present

## 2020-11-11 ENCOUNTER — Other Ambulatory Visit: Payer: Self-pay

## 2020-11-11 ENCOUNTER — Encounter (HOSPITAL_COMMUNITY): Payer: Self-pay

## 2020-11-11 ENCOUNTER — Ambulatory Visit (HOSPITAL_COMMUNITY)
Admission: EM | Admit: 2020-11-11 | Discharge: 2020-11-11 | Disposition: A | Discharge status: Home / Self Care | Payer: 59

## 2020-11-11 DIAGNOSIS — T300 Burn of unspecified body region, unspecified degree: Secondary | ICD-10-CM
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
## 2020-11-11 NOTE — Discharge Instructions (Addendum)
Keep area clean and dry Watch for signs of infection like increased redness, swelling, drainage
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
## 2021-01-10 ENCOUNTER — Emergency Department (HOSPITAL_COMMUNITY): Payer: 59

## 2021-01-10 ENCOUNTER — Encounter (HOSPITAL_COMMUNITY): Payer: Self-pay | Admitting: Emergency Medicine

## 2021-01-10 ENCOUNTER — Ambulatory Visit (HOSPITAL_COMMUNITY)
Admission: EM | Admit: 2021-01-10 | Discharge: 2021-01-10 | Disposition: A | Discharge status: Home / Self Care | Payer: 59

## 2021-01-10 ENCOUNTER — Other Ambulatory Visit: Payer: Self-pay

## 2021-01-10 ENCOUNTER — Encounter (HOSPITAL_COMMUNITY): Payer: Self-pay

## 2021-01-10 ENCOUNTER — Emergency Department (HOSPITAL_COMMUNITY)
Admission: EM | Admit: 2021-01-10 | Discharge: 2021-01-10 | Disposition: A | Discharge status: Home / Self Care | Payer: 59 | Attending: Emergency Medicine | Admitting: Emergency Medicine

## 2021-01-10 DIAGNOSIS — R000 Tachycardia, unspecified: Secondary | ICD-10-CM | POA: Diagnosis not present

## 2021-01-10 DIAGNOSIS — Z20822 Contact with and (suspected) exposure to covid-19: Secondary | ICD-10-CM | POA: Diagnosis not present

## 2021-01-10 DIAGNOSIS — R110 Nausea: Secondary | ICD-10-CM

## 2021-01-10 DIAGNOSIS — D259 Leiomyoma of uterus, unspecified: Secondary | ICD-10-CM | POA: Diagnosis not present

## 2021-01-10 DIAGNOSIS — R197 Diarrhea, unspecified: Secondary | ICD-10-CM | POA: Diagnosis not present

## 2021-01-10 DIAGNOSIS — N939 Abnormal uterine and vaginal bleeding, unspecified: Secondary | ICD-10-CM | POA: Diagnosis not present

## 2021-01-10 DIAGNOSIS — R1084 Generalized abdominal pain: Secondary | ICD-10-CM | POA: Diagnosis not present

## 2021-01-10 DIAGNOSIS — R1031 Right lower quadrant pain: Secondary | ICD-10-CM

## 2021-01-10 DIAGNOSIS — N739 Female pelvic inflammatory disease, unspecified: Secondary | ICD-10-CM | POA: Diagnosis not present

## 2021-01-10 DIAGNOSIS — N730 Acute parametritis and pelvic cellulitis: Secondary | ICD-10-CM

## 2021-01-10 DIAGNOSIS — R102 Pelvic and perineal pain: Secondary | ICD-10-CM

## 2021-01-10 DIAGNOSIS — N852 Hypertrophy of uterus: Secondary | ICD-10-CM | POA: Diagnosis not present

## 2021-01-10 LAB — I-STAT BETA HCG BLOOD, ED (MC, WL, AP ONLY): I-stat hCG, quantitative: <5 m[IU]/mL (ref <5)

## 2021-01-10 LAB — COMPREHENSIVE METABOLIC PANEL
ALT: 12 U/L (ref 0–44)
AST: 18 U/L (ref 15–41)
Albumin: 3.5 g/dL (ref 3.5–5.0)
Alkaline Phosphatase: 60 U/L (ref 38–126)
Anion gap: 13 (ref 5–15)
BUN: 7 mg/dL (ref 6–20)
CO2: 23 mmol/L (ref 22–32)
Calcium: 8.9 mg/dL (ref 8.9–10.3)
Chloride: 100 mmol/L (ref 98–111)
Creatinine, Ser: 0.9 mg/dL (ref 0.44–1.00)
GFR, Estimated: >60 mL/min (ref >60)
Glucose, Bld: 105 mg/dL — ABNORMAL HIGH (ref 70–99)
Potassium: 3.3 mmol/L — ABNORMAL LOW (ref 3.5–5.1)
Sodium: 136 mmol/L (ref 135–145)
Total Bilirubin: 0.8 mg/dL (ref 0.3–1.2)
Total Protein: 7.9 g/dL (ref 6.5–8.1)

## 2021-01-10 LAB — POCT URINALYSIS DIPSTICK, ED / UC
Glucose, UA: NEGATIVE mg/dL
Hgb urine dipstick: NEGATIVE
Ketones, ur: 15 mg/dL — AB
Leukocytes,Ua: NEGATIVE
Nitrite: NEGATIVE
Protein, ur: 100 mg/dL — AB
Specific Gravity, Urine: >=1.03 (ref 1.005–1.030)
Urobilinogen, UA: 0.2 mg/dL (ref 0.0–1.0)
pH: 5.5 (ref 5.0–8.0)

## 2021-01-10 LAB — RESP PANEL BY RT-PCR (FLU A&B, COVID) ARPGX2
Influenza A by PCR: NEGATIVE
Influenza B by PCR: NEGATIVE
SARS Coronavirus 2 by RT PCR: NEGATIVE

## 2021-01-10 LAB — WET PREP, GENITAL
Clue Cells Wet Prep HPF POC: NONE SEEN
Sperm: NONE SEEN
Trich, Wet Prep: NONE SEEN
Yeast Wet Prep HPF POC: NONE SEEN

## 2021-01-10 LAB — CBC
HCT: 32.2 % — ABNORMAL LOW (ref 36.0–46.0)
Hemoglobin: 9.9 g/dL — ABNORMAL LOW (ref 12.0–15.0)
MCH: 23.2 pg — ABNORMAL LOW (ref 26.0–34.0)
MCHC: 30.7 g/dL (ref 30.0–36.0)
MCV: 75.4 fL — ABNORMAL LOW (ref 80.0–100.0)
Platelets: 305 10*3/uL (ref 150–400)
RBC: 4.27 MIL/uL (ref 3.87–5.11)
RDW: 18.1 % — ABNORMAL HIGH (ref 11.5–15.5)
WBC: 11.2 10*3/uL — ABNORMAL HIGH (ref 4.0–10.5)
nRBC: 0 % (ref 0.0–0.2)

## 2021-01-10 LAB — LIPASE, BLOOD: Lipase: 26 U/L (ref 11–51)

## 2021-01-10 MED ORDER — SODIUM CHLORIDE 0.9 % IV BOLUS
1000.0000 mL | Freq: Once | INTRAVENOUS | Status: AC
  Administered 2021-01-10: 1000 mL via INTRAVENOUS

## 2021-01-10 MED ORDER — DOXYCYCLINE HYCLATE 100 MG PO TABS: 100.0000 mg | ORAL_TABLET | Freq: Once | ORAL | Status: DC

## 2021-01-10 MED ORDER — ONDANSETRON 4 MG PO TBDP: 4.0000 mg | ORAL_TABLET | Freq: Three times a day (TID) | ORAL | 0 refills | Status: DC | PRN

## 2021-01-10 MED ORDER — DOXYCYCLINE HYCLATE 100 MG PO CAPS: 100.0000 mg | ORAL_CAPSULE | Freq: Two times a day (BID) | ORAL | 0 refills | Status: DC

## 2021-01-10 MED ORDER — POTASSIUM CHLORIDE CRYS ER 20 MEQ PO TBCR
40.0000 meq | EXTENDED_RELEASE_TABLET | Freq: Once | ORAL | Status: AC
  Administered 2021-01-10: 40 meq via ORAL
  Filled 2021-01-10: qty 2

## 2021-01-10 MED ORDER — IOHEXOL 300 MG/ML  SOLN
100.0000 mL | Freq: Once | INTRAMUSCULAR | Status: AC | PRN
  Administered 2021-01-10: 100 mL via INTRAVENOUS

## 2021-01-10 MED ORDER — MORPHINE SULFATE (PF) 4 MG/ML IV SOLN
4.0000 mg | Freq: Once | INTRAVENOUS | Status: AC
  Administered 2021-01-10: 4 mg via INTRAVENOUS
  Filled 2021-01-10: qty 1

## 2021-01-10 MED ORDER — HYDROCODONE-ACETAMINOPHEN 5-325 MG PO TABS: 1.0000 | ORAL_TABLET | Freq: Four times a day (QID) | ORAL | 0 refills | Status: DC | PRN

## 2021-01-10 MED ORDER — CEFTRIAXONE SODIUM 500 MG IJ SOLR: 500.0000 mg | Freq: Once | INTRAMUSCULAR | Status: DC

## 2021-01-10 MED ORDER — ONDANSETRON HCL 4 MG/2ML IJ SOLN
4.0000 mg | Freq: Once | INTRAMUSCULAR | Status: AC
  Administered 2021-01-10: 4 mg via INTRAVENOUS
  Filled 2021-01-10: qty 2

## 2021-01-10 NOTE — ED Provider Notes (Signed)
[place]    CSN: [id number] Arrival date & time: 01/10/21  0801      History   Chief Complaint Chief Complaint  Patient presents with  . Abdominal Pain  . Diarrhea  . Fever    HPI Dana Campbell is a 40 y.o. female.   Patient presents with sharp constant lower quadrant pain that began on 2/5 that has not improved. Associated diarrhea, fever and nausea. Fever and nausea resolved. Diarrhea present with consistency changing from loose, watery to soft brown.Denies vomiting,  Attempted cold medication with no relief. Tested for COVID results negative. Has history of fibroids. LMP 1/27. No possibility of pregnancy.   History reviewed. No pertinent past medical history.  There are no problems to display for this patient.   History reviewed. No pertinent surgical history.  OB History   No obstetric history on file.      Home Medications    Prior to Admission medications   Medication Sig Start Date End Date Taking? Authorizing Provider  acetaminophen (TYLENOL) 325 MG tablet Take 650 mg by mouth every 6 (six) hours as needed.    [provider]  benzonatate (TESSALON) 100 MG capsule Take 1 capsule (100 mg total) by mouth every 8 (eight) hours. 09/15/18   Wurst, [place], PA-C  cetirizine (ZYRTEC) 10 MG tablet Take 1 tablet (10 mg total) by mouth daily. 03/25/20   Loura Halt A, NP  doxycycline (VIBRAMYCIN) 100 MG capsule Take 1 capsule (100 mg total) by mouth 2 (two) times daily. 09/14/18   Wurst, [place], PA-C  meclizine (ANTIVERT) 25 MG tablet Take 1 tablet (25 mg total) by mouth 3 (three) times daily as needed for dizziness. 09/06/14   Harden Mo, MD  Olopatadine HCl 0.2 % SOLN Apply 1 drop to eye daily. 03/25/20   Loura Halt A, NP  ranitidine (ZANTAC) 150 MG capsule Take 1 capsule (150 mg total) by mouth 2 (two) times daily. 05/14/12 05/14/13  Melynda Ripple, MD    Family History History reviewed. No pertinent family history.  Social  History Social History   Tobacco Use  . Smoking status: Never Smoker  . Smokeless tobacco: Never Used  Substance Use Topics  . Alcohol use: Yes    Comment: occ   . Drug use: No     Allergies   Cinnamon   Review of Systems Review of Systems  Constitutional: Positive for fever. Negative for activity change, appetite change, chills, diaphoresis, fatigue and unexpected weight change.  HENT: Negative.   Respiratory: Negative.   Cardiovascular: Negative.   Gastrointestinal: Positive for abdominal pain and diarrhea. Negative for abdominal distention, anal bleeding, blood in stool, constipation, nausea, rectal pain and vomiting.  Endocrine: Negative.   Genitourinary: Negative.   Skin: Negative.   Neurological: Negative.   Psychiatric/Behavioral: Negative.      Physical Exam Triage Vital Signs ED Triage Vitals  Enc Vitals Group     BP 01/10/21 0818 (!) 145/89     Pulse Rate 01/10/21 0818 (!) 111     Resp 01/10/21 0818 19     Temp 01/10/21 0818 100 F (37.8 C)     Temp src --      SpO2 01/10/21 0818 98 %     Weight --      Height --      Head Circumference --      Peak Flow --      Pain Score 01/10/21 0816 8     Pain Loc --  Pain Edu? --      Excl. in [place]? --    No data found.  Updated Vital Signs BP (!) 145/89   Pulse (!) 111   Temp 100 F (37.8 C)   Resp 19   LMP 12/26/2020 (Approximate)   SpO2 98%   Visual Acuity Right Eye Distance:   Left Eye Distance:   Bilateral Distance:    Right Eye Near:   Left Eye Near:    Bilateral Near:     Physical Exam Constitutional:      Appearance: She is well-developed and normal weight.  HENT:     Head: Normocephalic.  Eyes:     Extraocular Movements: Extraocular movements intact.  Cardiovascular:     Heart sounds: Normal heart sounds.  Pulmonary:     Effort: Pulmonary effort is normal.     Breath sounds: Normal breath sounds.  Abdominal:     General: Abdomen is flat. Bowel sounds are normal. There is no  distension. There are no signs of injury.     Palpations: Abdomen is soft. There is no hepatomegaly or splenomegaly.     Tenderness: There is abdominal tenderness in the right lower quadrant. There is no right CVA tenderness, left CVA tenderness, guarding or rebound.     Hernia: There is no hernia in the umbilical area.  Skin:    General: Skin is warm and dry.  Neurological:     General: No focal deficit present.     Mental Status: She is alert and oriented to person, place, and time.  Psychiatric:        Mood and Affect: Mood normal.        Behavior: Behavior normal.      UC Treatments / Results  Labs (all labs ordered are listed, but only abnormal results are displayed) Labs Reviewed  POCT URINALYSIS DIPSTICK, ED / UC    EKG   Radiology No results found.  Procedures Procedures (including critical care time)  Medications Ordered in UC Medications - No data to display  Initial Impression / Assessment and Plan / UC Course  I have reviewed the triage vital signs and the nursing notes.  Pertinent labs & imaging results that were available during my care of the patient were reviewed by me and considered in my medical decision making (see chart for details).  Acute RLQ abdominal pain  Due to 6 day period of RLQ pain with associated fever and diarrhea, patient advised for further evaluation and diagnostic testing in Emergency department. Stable condition to drive self to ED.   1.urinalysis- positive for protein, ketones and bilirubin   Clinical Course as of 01/10/21 0941  Fri Jan 10, 2021  0912 Protein(!): 100 [AW]    Clinical Course User Index [AW] Hans Eden, NP     Final Clinical Impressions(s) / UC Diagnoses   Final diagnoses:  None   Discharge Instructions   None    ED Prescriptions    None     PDMP not reviewed this encounter.   Hans Eden, NP 01/10/21 [phone]

## 2021-01-10 NOTE — ED Triage Notes (Signed)
Patient here for evaluation of RLQ pain that started on Saturday night. Went to urgent care for evaluation, urinalysis showed some bilirubin so patient was sent to ED for further evaluation.

## 2021-01-10 NOTE — Discharge Instructions (Addendum)
As we discussed, your CT scan here did not show any evidence of appendicitis.  You do have evidence of uterine fibroids.  As we discussed, given the inflammation seen in the right lower quadrant on your CT scan, we will plan to treat as a possible pelvic inflammatory disease.  Take antibiotics as directed. Please take all of your antibiotics until finished.  Please follow-up with your OB/GYN.  Take pain medications as directed for break through pain. Do not drive or operate machinery while taking this medication.   Take Zofran for nausea.  Closely monitor your symptoms.  If your pain gets severe, consistent, or worse or you start having worsening fever, vomiting or any other worsening conditions, return to emergency department for emergent evaluation.

## 2021-01-10 NOTE — ED Triage Notes (Addendum)
Pt in with c/o sharp RLQ pain, diarrhea, fever tmax 102 that started on Sunday. Fever and diarrhea have resolved  Pt was taking cold and flu medicine for relief  Pt has been covid tested with negative results

## 2021-01-10 NOTE — ED Notes (Signed)
Patient transported to Ultrasound 

## 2021-01-10 NOTE — ED Notes (Signed)
Just returned from CT, pain of 0

## 2021-01-10 NOTE — ED Notes (Signed)
US pelvis completed was charted in error

## 2021-01-10 NOTE — ED Notes (Signed)
Assist with the pelvic exam

## 2021-01-10 NOTE — ED Provider Notes (Signed)
[facility] DEPARTMENT Provider Note   CSN: [id number] Arrival date & time: 01/10/21  0973     History Chief Complaint  Patient presents with  . Abdominal Pain    Dana Campbell is a 40 y.o. female who presents for evaluation of abdominal pain, nausea, diarrhea that has been ongoing for the last 6 days.  She reports that about 6 days ago, she woke up and had some generalized abdominal pain.  She reports that she had some nausea as well as some diarrhea.  She took her temperature at the time measured a fever 101.  He states that she had had loose stools.  No blood noted in stool.  No recent antibiotics, travel outside the country.  She was tested for Covid 2 days after symptoms and states it was negative.  She has had a COVID vaccine x3.  She states that she has had some decreased appetite, nausea but denies any vomiting.  She states she started to feel little bit better until yesterday when she started having some worsening pain that localized to the right lower quadrant.  Still having follow-up and reports continued have decreased appetite, nausea, diarrhea.  She went to urgent care today and was sent to the ED for further evaluation.  Her LMP was 12/26/20.  She denies any vaginal discharge.  No history abdominal surgeries.  She denies any chest pain, difficulty breathing, rhinorrhea, congestion, dysuria, hematuria.   The history is provided by the patient.       History reviewed. No pertinent past medical history.  There are no problems to display for this patient.   No past surgical history on file.   OB History   No obstetric history on file.     No family history on file.  Social History   Tobacco Use  . Smoking status: Never Smoker  . Smokeless tobacco: Never Used  Substance Use Topics  . Alcohol use: Yes    Comment: occ   . Drug use: No    Home Medications Prior to Admission medications   Medication Sig Start Date End Date Taking?  Authorizing Provider  doxycycline (VIBRAMYCIN) 100 MG capsule Take 1 capsule (100 mg total) by mouth 2 (two) times daily. 01/10/21  Yes Volanda Napoleon, PA-C  HYDROcodone-acetaminophen (NORCO/VICODIN) 5-325 MG tablet Take 1-2 tablets by mouth every 6 (six) hours as needed. 01/10/21  Yes Providence Lanius A, PA-C  ondansetron (ZOFRAN ODT) 4 MG disintegrating tablet Take 1 tablet (4 mg total) by mouth every 8 (eight) hours as needed for nausea or vomiting. 01/10/21  Yes Volanda Napoleon, PA-C  acetaminophen (TYLENOL) 325 MG tablet Take 650 mg by mouth every 6 (six) hours as needed.    [provider]  benzonatate (TESSALON) 100 MG capsule Take 1 capsule (100 mg total) by mouth every 8 (eight) hours. 09/15/18   Wurst, [place], PA-C  cetirizine (ZYRTEC) 10 MG tablet Take 1 tablet (10 mg total) by mouth daily. 03/25/20   Orvan July, NP  meclizine (ANTIVERT) 25 MG tablet Take 1 tablet (25 mg total) by mouth 3 (three) times daily as needed for dizziness. 09/06/14   Harden Mo, MD  Olopatadine HCl 0.2 % SOLN Apply 1 drop to eye daily. 03/25/20   Loura Halt A, NP  ranitidine (ZANTAC) 150 MG capsule Take 1 capsule (150 mg total) by mouth 2 (two) times daily. 05/14/12 05/14/13  Melynda Ripple, MD    Allergies    Cinnamon  Review of Systems   Review of Systems  Constitutional: Positive for appetite change and fever.  Respiratory: Negative for cough and shortness of breath.   Cardiovascular: Negative for chest pain.  Gastrointestinal: Positive for abdominal pain, diarrhea and nausea. Negative for vomiting.  Genitourinary: Negative for dysuria and hematuria.  Neurological: Negative for headaches.  All other systems reviewed and are negative.   Physical Exam Updated Vital Signs BP 129/61 (BP Location: Right Arm)   Pulse (!) 105   Temp 98.4 F (36.9 C) (Oral)   Resp 18   Ht 5\' 4"  (1.626 m)   Wt (!) 141.1 kg   LMP 12/26/2020 (Approximate)   SpO2 100%   BMI 53.38 kg/m   Physical  Exam Vitals and nursing note reviewed. Exam conducted with a chaperone present.  Constitutional:      Appearance: Normal appearance. She is well-developed and well-nourished.  HENT:     Head: Normocephalic and atraumatic.     Mouth/Throat:     Mouth: Oropharynx is clear and moist and mucous membranes are normal.  Eyes:     General: Lids are normal.     Extraocular Movements: EOM normal.     Conjunctiva/sclera: Conjunctivae normal.     Pupils: Pupils are equal, round, and reactive to light.  Cardiovascular:     Rate and Rhythm: Regular rhythm. Tachycardia present.     Pulses: Normal pulses.     Heart sounds: Normal heart sounds. No murmur heard. No friction rub. No gallop.   Pulmonary:     Effort: Pulmonary effort is normal.     Breath sounds: Normal breath sounds.     Comments: Lungs clear to auscultation bilaterally.  Symmetric chest rise.  No wheezing, rales, rhonchi. Abdominal:     Palpations: Abdomen is soft. Abdomen is not rigid.     Tenderness: There is abdominal tenderness in the right lower quadrant. There is no right CVA tenderness, left CVA tenderness or guarding. Positive signs include McBurney's sign.     Comments: Abdomen soft, nondistended.  Tenderness palpation noted to McBurney's point.  No rigidity, guarding.  No CVA tenderness noted bilaterally.  Genitourinary:    Cervix: Discharge present. No cervical motion tenderness.     Adnexa:        Right: No mass or tenderness.         Left: No mass or tenderness.       Comments: The exam was performed with a chaperone present Otila Kluver, [employer]). Normal external female genitalia. No lesions, rash, or sores.  Small amount of discharge noted from cervix.  No CMT.  No adnexal mass or tenderness noted bilaterally. Musculoskeletal:        General: Normal range of motion.     Cervical back: Full passive range of motion without pain.  Skin:    General: Skin is warm and dry.     Capillary Refill: Capillary refill takes less than 2  seconds.  Neurological:     Mental Status: She is alert and oriented to person, place, and time.  Psychiatric:        Mood and Affect: Mood and affect normal.        Speech: Speech normal.     ED Results / Procedures / Treatments   Labs (all labs ordered are listed, but only abnormal results are displayed) Labs Reviewed  WET PREP, GENITAL - Abnormal; Notable for the following components:      Result Value   WBC, Wet Prep HPF POC FEW (*)  All other components within normal limits  COMPREHENSIVE METABOLIC PANEL - Abnormal; Notable for the following components:   Potassium 3.3 (*)    Glucose, Bld 105 (*)    All other components within normal limits  CBC - Abnormal; Notable for the following components:   WBC 11.2 (*)    Hemoglobin 9.9 (*)    HCT 32.2 (*)    MCV 75.4 (*)    MCH 23.2 (*)    RDW 18.1 (*)    All other components within normal limits  RESP PANEL BY RT-PCR (FLU A&B, COVID) ARPGX2  LIPASE, BLOOD  I-STAT BETA HCG BLOOD, ED ([facility], [facility], [facility] ONLY)  GC/CHLAMYDIA PROBE AMP ([place]) NOT AT [facility]    EKG None  Radiology CT ABDOMEN PELVIS W CONTRAST  Result Date: 01/10/2021 CLINICAL DATA:  Right lower quadrant pain for the past 2 days. Diarrhea for the past 6 days. EXAM: CT ABDOMEN AND PELVIS WITH CONTRAST TECHNIQUE: Multidetector CT imaging of the abdomen and pelvis was performed using the standard protocol following bolus administration of intravenous contrast. CONTRAST:  160mL OMNIPAQUE IOHEXOL 300 MG/ML  SOLN COMPARISON:  CT abdomen pelvis dated December 01, 2019. FINDINGS: Lower chest: No acute abnormality. Hepatobiliary: No focal liver abnormality is seen. No gallstones, gallbladder wall thickening, or biliary dilatation. Pancreas: Unremarkable. No pancreatic ductal dilatation or surrounding inflammatory changes. Spleen: Normal in size without focal abnormality. Adrenals/Urinary Tract: Adrenal glands are unremarkable. Kidneys are normal, without renal calculi, focal  lesion, or hydronephrosis. Bladder is unremarkable. Stomach/Bowel: Stomach is within normal limits. Appendix appears normal. No evidence of bowel wall thickening, distention, or inflammatory changes. Vascular/Lymphatic: New mild fat stranding surrounding the right gonadal vessels. New prominent subcentimeter para-aortic, central mesenteric, and right lower quadrant mesenteric lymph nodes, likely reactive. Reproductive: Markedly enlarged, fibroid uterus has slightly increased in size when compared to the prior study, currently measuring 9.9 x 19.2 x 20.4 cm, previously 7.5 x 17.7 x 19.8 cm. No adnexal mass. There is mild fat stranding adjacent to the right ovary and in the right lower pelvis. Other: Slight interval increase in size of the small fat and fluid containing umbilical hernia. No free fluid or pneumoperitoneum. Musculoskeletal: No acute or significant osseous findings. Chronic bilateral sacroiliitis. IMPRESSION: 1. Mild fat stranding adjacent to the right gonadal vessels, right ovary, and in the right lower pelvis. This is nonspecific. Differential considerations include pelvic inflammatory disease, gonadal vein thrombosis, and less likely ovarian torsion. This could be further evaluated with pelvic ultrasound, although visualization of the ovaries will likely be difficult due to the markedly enlarged fibroid uterus. 2. Normal appendix. 3. Slight interval increase in size of the small fat and fluid containing umbilical hernia. Correlate for incarceration. 4. Chronic bilateral sacroiliitis. Electronically Signed   By: Titus Dubin M.D.   On: 01/10/2021 13:00   US PELVIC COMPLETE WITH TRANSVAGINAL  Result Date: 01/10/2021 CLINICAL DATA:  Pelvic pain since last Saturday. EXAM: TRANSABDOMINAL AND TRANSVAGINAL ULTRASOUND OF PELVIS TECHNIQUE: Both transabdominal and transvaginal ultrasound examinations of the pelvis were performed. Transabdominal technique was performed for global imaging of the pelvis  including uterus, ovaries, adnexal regions, and pelvic cul-de-sac. It was necessary to proceed with endovaginal exam following the transabdominal exam to visualize the ovaries and endometrium. COMPARISON:  CT scan 01/10/2021 FINDINGS: Uterus Measurements: Difficult to measure by ultrasound. By CT it measures 20 x 28 x 9.4 cm. Multiple large fibroids. The largest measures 7.4 x 7.6 x 7.6 cm. Endometrium Thickness: Not visualized. Right ovary Measurements:  Not visualized. Left ovary Measurements: Not visualized. Other findings No abnormal free fluid. IMPRESSION: 1. Markedly enlarged uterus better evaluated on the CT scan. Numerous large fibroids. 2. Ovaries could not be visualized. 3. No free pelvic fluid collections. Electronically Signed   By: Marijo Sanes M.D.   On: 01/10/2021 15:57    Procedures Procedures   Medications Ordered in ED Medications  doxycycline (VIBRA-TABS) tablet 100 mg (has no administration in time range)  cefTRIAXone (ROCEPHIN) injection 500 mg (has no administration in time range)  ondansetron (ZOFRAN) injection 4 mg (4 mg Intravenous Given 01/10/21 1052)  morphine 4 MG/ML injection 4 mg (4 mg Intravenous Given 01/10/21 1052)  sodium chloride 0.9 % bolus 1,000 mL (0 mLs Intravenous Stopped 01/10/21 1145)  potassium chloride SA (KLOR-CON) CR tablet 40 mEq (40 mEq Oral Given 01/10/21 1237)  iohexol (OMNIPAQUE) 300 MG/ML solution 100 mL (100 mLs Intravenous Contrast Given 01/10/21 1217)    ED Course  I have reviewed the triage vital signs and the nursing notes.  Pertinent labs & imaging results that were available during my care of the patient were reviewed by me and considered in my medical decision making (see chart for details).    MDM Rules/Calculators/A&P                          40 year old female who presents for evaluation of abdominal pain, fever, nausea, diarrhea that is been ongoing for 6 days.  Reports initially pain was generalized but now focal to the right lower  quadrant.  Seen at urgent care and evaluated and sent to the emergency department.  On initial arrival, she is afebrile, tachycardic, tachypneic, hypertensive.  On exam, she has focal tenderness in the right lower quadrant.  Concern for appendicitis versus viral GI process.  Also consider COVID-19.  Low suspicion for ovarian etiology.  Plan check labs.  Urine ordered at urgent care.  CMP shows potassium of 3.3. BUN and creatinine within normal limits. CBC shows leukocytosis of 11.2. Hemoglobin is 9.9. I-STAT beta is negative. Lipase unremarkable.  CT scan shows mild fat stranding adjacent to the right gonadal vessels, right ovary and right lower pelvis.  Normal appendix.  She is a uterine fibroid.  Pelvic exam as documented above.  Patient had no CMT.  She did have some mild discharge noted from the cervix.  No adnexal mass or tenderness noted bilaterally.  Will obtain ultrasound given her CT findings.  Ultrasound shows markedly enlarged uterus secondary to fibroids.  Ovaries cannot be visualized.  Reevaluation.  Patient resting comfortably.  Repeat abdominal exam shows improved tenderness.  Patient states she does not have any pain at this time.  I discussed with Dr. Kennon Rounds (OB/GYN) regarding patient's CT findings and U/S. She does not feel that this represents ovarian torsion given no pelvic mass on CT. I agree with this as patient is not having pain that would be typical of ovarian torsion.  She does recommend that given the inflammation on CT scan, that we treat this as a PID.  Discussed results with patient.  She is agreeable.  Patient denies any pain at this time.  Patient with no known drug allergies.  Patient given treatment for PID.  Patient advised to closely monitor symptoms and return emergency department if she has any worsening concerning symptoms.  Instructed patient to follow-up with her OB/GYN. At this time, patient exhibits no emergent life-threatening condition that require further  evaluation in ED. Patient had  ample opportunity for questions and discussion. All patient's questions were answered with full understanding. Strict return precautions discussed. Patient expresses understanding and agreement to plan.   Portions of this note were generated with [employer]. Dictation errors may occur despite best attempts at proofreading.   Final Clinical Impression(s) / ED Diagnoses Final diagnoses:  Right lower quadrant abdominal pain  Uterine leiomyoma, unspecified location  PID (acute pelvic inflammatory disease)    Rx / DC Orders ED Discharge Orders         Ordered    doxycycline (VIBRAMYCIN) 100 MG capsule  2 times daily        01/10/21 1634    ondansetron (ZOFRAN ODT) 4 MG disintegrating tablet  Every 8 hours PRN        01/10/21 1634    HYDROcodone-acetaminophen (NORCO/VICODIN) 5-325 MG tablet  Every 6 hours PRN        01/10/21 1634           Volanda Napoleon, PA-C 01/10/21 1720    Wyvonnia Dusky, MD 01/10/21 [phone]

## 2021-01-10 NOTE — Discharge Instructions (Addendum)
Follow up at Emergency Department as soon as possible. Do not eat until seen by a provider.

## 2021-01-13 LAB — GC/CHLAMYDIA PROBE AMP (~~LOC~~) NOT AT ARMC
Chlamydia: NEGATIVE
Comment: NEGATIVE
Comment: NORMAL
Neisseria Gonorrhea: NEGATIVE

## 2021-06-26 DIAGNOSIS — A09 Infectious gastroenteritis and colitis, unspecified: Secondary | ICD-10-CM | POA: Diagnosis present

## 2021-06-27 ENCOUNTER — Other Ambulatory Visit (HOSPITAL_COMMUNITY): Payer: Self-pay

## 2021-06-27 MED ORDER — ONDANSETRON 4 MG PO TBDP
4.0000 mg | ORAL_TABLET | Freq: Three times a day (TID) | ORAL | 0 refills | Status: DC | PRN
  Filled 2021-06-27: qty 12, 4d supply, fill #0
  Filled 2021-06-27: qty 8, 3d supply, fill #0

## 2021-06-30 ENCOUNTER — Encounter (HOSPITAL_COMMUNITY): Payer: Self-pay | Admitting: Emergency Medicine

## 2021-06-30 ENCOUNTER — Other Ambulatory Visit (HOSPITAL_COMMUNITY): Payer: Self-pay

## 2021-06-30 ENCOUNTER — Other Ambulatory Visit: Payer: Self-pay

## 2021-06-30 ENCOUNTER — Ambulatory Visit (HOSPITAL_COMMUNITY)
Admission: EM | Admit: 2021-06-30 | Discharge: 2021-06-30 | Disposition: A | Discharge status: Home / Self Care | Payer: 59 | Attending: Internal Medicine | Admitting: Internal Medicine

## 2021-06-30 DIAGNOSIS — K0889 Other specified disorders of teeth and supporting structures: Secondary | ICD-10-CM

## 2021-06-30 DIAGNOSIS — K649 Unspecified hemorrhoids: Secondary | ICD-10-CM

## 2021-06-30 MED ORDER — HYDROCORTISONE ACETATE 25 MG RE SUPP
25.0000 mg | Freq: Two times a day (BID) | RECTAL | 0 refills | Status: DC
  Filled 2021-06-30: qty 12, 6d supply, fill #0

## 2021-06-30 NOTE — ED Provider Notes (Signed)
[facility]    CSN: [id number] Arrival date & time: 06/30/21  0816      History   Chief Complaint Chief Complaint  Patient presents with   Nausea   Diarrhea    HPI Dana Campbell is a 40 y.o. female comes to urgent care with nausea, vomiting, diarrhea and bloody stool of 9 days duration.  Patient symptoms started 9 days ago and has improved.  Patient is particularly worried about blood that she noticed in her stool.  Initially the blood appeared to be mixed with the stool when the stool was loose.  Currently patient is diarrhea has subsided and the blood is on the stool.  She also has bright red blood when she wipes.  Bowel movements are not painful.  She has occasional abdominal cramps which is resolved.  No fever or chills.  This is the first episode of bloody bowel movement.  She denies any history of hemorrhoids.  Patient also complains of tooth ache which started yesterday.  She has tried lidocaine gel with good results.Marland Kitchen   HPI  History reviewed. No pertinent past medical history.  There are no problems to display for this patient.   History reviewed. No pertinent surgical history.  OB History   No obstetric history on file.      Home Medications    Prior to Admission medications   Medication Sig Start Date End Date Taking? Authorizing Provider  hydrocortisone (ANUSOL-HC) 25 MG suppository Place 1 suppository (25 mg total) rectally 2 (two) times daily. 06/30/21  Yes Clotilde Loth, Myrene Galas, MD  acetaminophen (TYLENOL) 325 MG tablet Take 650 mg by mouth every 6 (six) hours as needed.    [provider]  benzonatate (TESSALON) 100 MG capsule Take 1 capsule (100 mg total) by mouth every 8 (eight) hours. 09/15/18   Wurst, [place], PA-C  cetirizine (ZYRTEC) 10 MG tablet Take 1 tablet (10 mg total) by mouth daily. 03/25/20   Loura Halt A, NP  doxycycline (VIBRAMYCIN) 100 MG capsule Take 1 capsule (100 mg total) by mouth 2 (two) times daily. 01/10/21   Volanda Napoleon, PA-C  HYDROcodone-acetaminophen (NORCO/VICODIN) 5-325 MG tablet Take 1-2 tablets by mouth every 6 (six) hours as needed. 01/10/21   Providence Lanius A, PA-C  ibuprofen (ADVIL) 800 MG tablet TAKE 1 TABLET 3 TIMES A DAY BY MOUTH FOR 2 DAYS 09/09/20 09/09/21  Waymon Amato, MD  meclizine (ANTIVERT) 25 MG tablet Take 1 tablet (25 mg total) by mouth 3 (three) times daily as needed for dizziness. 09/06/14   Harden Mo, MD  Olopatadine HCl 0.2 % SOLN Apply 1 drop to eye daily. 03/25/20   Loura Halt A, NP  ondansetron (ZOFRAN ODT) 4 MG disintegrating tablet Take 1 tablet (4 mg total) by mouth every 8 (eight) hours as needed for nausea or vomiting. 01/10/21   [place] Lanius A, PA-C  ondansetron (ZOFRAN-ODT) 4 MG disintegrating tablet Dissolve 1 tablet (4 mg total) under the tongue every 8 (eight) hours as needed for nausea 06/26/21     ranitidine (ZANTAC) 150 MG capsule Take 1 capsule (150 mg total) by mouth 2 (two) times daily. 05/14/12 05/14/13  Melynda Ripple, MD  tranexamic acid (LYSTEDA) 650 MG TABS tablet TAKE 2 TABLETS 3 TIMES A DAY BY MOUTH FOR 5 DAYS 09/09/20 09/09/21  Waymon Amato, MD    Family History History reviewed. No pertinent family history.  Social History Social History   Tobacco Use   Smoking status: Never  Smokeless tobacco: Never  Substance Use Topics   Alcohol use: Yes    Comment: occ    Drug use: No     Allergies   Cinnamon   Review of Systems Review of Systems  HENT:  Positive for dental problem.   Gastrointestinal:  Positive for abdominal pain, blood in stool and vomiting. Negative for constipation, diarrhea and rectal pain.  Genitourinary: Negative.   Neurological:  Negative for dizziness and light-headedness.    Physical Exam Triage Vital Signs ED Triage Vitals  Enc Vitals Group     BP 06/30/21 0847 (!) 145/91     Pulse Rate 06/30/21 0847 (!) 103     Resp 06/30/21 0847 17     Temp 06/30/21 0847 99.7 F (37.6 C)     Temp Source 06/30/21 0847  Oral     SpO2 06/30/21 0847 99 %     Weight --      Height --      Head Circumference --      Peak Flow --      Pain Score 06/30/21 0844 3     Pain Loc --      Pain Edu? --      Excl. in [place]? --    No data found.  Updated Vital Signs BP (!) 145/91 (BP Location: Left Arm)   Pulse (!) 103   Temp 99.7 F (37.6 C) (Oral)   Resp 17   LMP 06/11/2021   SpO2 99%   Visual Acuity Right Eye Distance:   Left Eye Distance:   Bilateral Distance:    Right Eye Near:   Left Eye Near:    Bilateral Near:     Physical Exam Vitals and nursing note reviewed.  HENT:     Mouth/Throat:     Comments: Dental caries.  Partially erupted wisdom tooth-left mandible. Cardiovascular:     Rate and Rhythm: Normal rate and regular rhythm.     Pulses: Normal pulses.     Heart sounds: Normal heart sounds.  Pulmonary:     Effort: Pulmonary effort is normal.     Breath sounds: Normal breath sounds.  Neurological:     Mental Status: She is alert.     UC Treatments / Results  Labs (all labs ordered are listed, but only abnormal results are displayed) Labs Reviewed - No data to display  EKG   Radiology No results found.  Procedures Procedures (including critical care time)  Medications Ordered in UC Medications - No data to display  Initial Impression / Assessment and Plan / UC Course  I have reviewed the triage vital signs and the nursing notes.  Pertinent labs & imaging results that were available during my care of the patient were reviewed by me and considered in my medical decision making (see chart for details).     1.  Hemorrhoidal bleed likely internal hemorrhoids grade 1: Anusol Bleeding is resolving Conjunctiva is pink as patient is not anemic If symptoms worsen, patient may need to follow-up with the proctologist  3.  Dental pain: Continue viscous lidocaine use NSAIDs as needed Patient is advised to follow-up with her dentist for further evaluation. Return precautions  given.  Final Clinical Impressions(s) / UC Diagnoses   Final diagnoses:  Hemorrhoids, unspecified hemorrhoid type  Pain, dental     Discharge Instructions      Please use medications as prescribed Avoid constipation Increase oral fluid intake If symptoms worsen please return to urgent care Reach out to your  dentist to have wisdom tooth evaluated.   ED Prescriptions     Medication Sig Dispense Auth. Provider   hydrocortisone (ANUSOL-HC) 25 MG suppository Place 1 suppository (25 mg total) rectally 2 (two) times daily. 12 suppository Oliviana Mcgahee, Myrene Galas, MD      PDMP not reviewed this encounter.   Chase Picket, MD 06/30/21 [phone]

## 2021-06-30 NOTE — Discharge Instructions (Addendum)
Please use medications as prescribed Avoid constipation Increase oral fluid intake If symptoms worsen please return to urgent care Reach out to your dentist to have wisdom tooth evaluated.

## 2021-06-30 NOTE — ED Triage Notes (Signed)
Pt presents with nausea and diarrhea xs 9 days. States over the weekend has blood in stool, had telehealth visit and was prescribed zofran and probiotic with relief.   States also having dental pain xs 2 days.

## 2021-07-03 ENCOUNTER — Telehealth (HOSPITAL_COMMUNITY): Payer: Self-pay | Admitting: Emergency Medicine

## 2021-07-03 NOTE — Telephone Encounter (Signed)
Patient left a voicemail over night looking for COVID results.  Upon review, no COVID test ordered at visit.  Reviewed with patient, who states she was swabbed.  Encouraged her to return for reswab this morning, she is waiting for results for HAW.

## 2021-10-24 ENCOUNTER — Ambulatory Visit (HOSPITAL_COMMUNITY)
Admission: EM | Admit: 2021-10-24 | Discharge: 2021-10-24 | Disposition: A | Discharge status: Home / Self Care | Payer: 59 | Attending: Internal Medicine | Admitting: Internal Medicine

## 2021-10-24 ENCOUNTER — Other Ambulatory Visit (HOSPITAL_COMMUNITY): Payer: Self-pay

## 2021-10-24 ENCOUNTER — Encounter (HOSPITAL_COMMUNITY): Payer: Self-pay | Admitting: Emergency Medicine

## 2021-10-24 ENCOUNTER — Other Ambulatory Visit: Payer: Self-pay

## 2021-10-24 DIAGNOSIS — K648 Other hemorrhoids: Secondary | ICD-10-CM | POA: Diagnosis not present

## 2021-10-24 DIAGNOSIS — R197 Diarrhea, unspecified: Secondary | ICD-10-CM | POA: Insufficient documentation

## 2021-10-24 LAB — C DIFFICILE QUICK SCREEN W PCR REFLEX
C Diff antigen: NEGATIVE
C Diff interpretation: NOT DETECTED
C Diff toxin: NEGATIVE

## 2021-10-24 MED ORDER — LACTINEX PO CHEW
1.0000 | CHEWABLE_TABLET | Freq: Three times a day (TID) | ORAL | 0 refills | Status: DC
  Filled 2021-10-24: qty 50, 17d supply, fill #0

## 2021-10-24 MED ORDER — ONDANSETRON 4 MG PO TBDP
4.0000 mg | ORAL_TABLET | Freq: Three times a day (TID) | ORAL | 0 refills | Status: DC | PRN
  Filled 2021-10-24: qty 20, 7d supply, fill #0

## 2021-10-24 NOTE — Discharge Instructions (Addendum)
Increase oral fluid intake We will call you with recommendations if labs are abnormal Take medications as prescribed If you have abdominal pain or worsening diarrhea ,please return to urgent care to be reevaluated.

## 2021-10-24 NOTE — ED Provider Notes (Signed)
[place]    CSN: [id number] Arrival date & time: 10/24/21  4580      History   Chief Complaint Chief Complaint  Patient presents with   Diarrhea   Rectal Bleeding    HPI CHEVIE BIRKHEAD is a 40 y.o. female comes to the urgent care with 10-day history of diarrhea.  Patient's symptoms started fairly suddenly and has been persistent.  She passes anywhere from 2-8 bouts of watery diarrhea in the day.  It is associated with abdominal cramping.  A couple of days ago patient started noticing bright red blood.  No fever or chills.  No recent antibiotic use.  Patient has tried just single dose of probiotics and currently taking Zofran as needed for nausea.  No abdominal bloating or pain.  Patient denies any vomiting.  HPI  History reviewed. No pertinent past medical history.  There are no problems to display for this patient.   History reviewed. No pertinent surgical history.  OB History   No obstetric history on file.      Home Medications    Prior to Admission medications   Medication Sig Start Date End Date Taking? Authorizing Provider  lactobacillus acidophilus & bulgar (LACTINEX) chewable tablet Chew 1 tablet by mouth 3 (three) times daily with meals. 10/24/21  Yes Jarron Curley, Myrene Galas, MD  ondansetron (ZOFRAN-ODT) 4 MG disintegrating tablet Take 1 tablet (4 mg total) by mouth every 8 (eight) hours as needed for nausea or vomiting. 10/24/21  Yes Kymoni Lesperance, Myrene Galas, MD  acetaminophen (TYLENOL) 325 MG tablet Take 650 mg by mouth every 6 (six) hours as needed.    [provider]  benzonatate (TESSALON) 100 MG capsule Take 1 capsule (100 mg total) by mouth every 8 (eight) hours. 09/15/18   Wurst, [place], PA-C  cetirizine (ZYRTEC) 10 MG tablet Take 1 tablet (10 mg total) by mouth daily. 03/25/20   Loura Halt A, NP  doxycycline (VIBRAMYCIN) 100 MG capsule Take 1 capsule (100 mg total) by mouth 2 (two) times daily. 01/10/21   Volanda Napoleon, PA-C   HYDROcodone-acetaminophen (NORCO/VICODIN) 5-325 MG tablet Take 1-2 tablets by mouth every 6 (six) hours as needed. 01/10/21   Volanda Napoleon, PA-C  hydrocortisone (ANUSOL-HC) 25 MG suppository Place 1 suppository (25 mg total) rectally 2 (two) times daily. 06/30/21   LampteyMyrene Galas, MD  meclizine (ANTIVERT) 25 MG tablet Take 1 tablet (25 mg total) by mouth 3 (three) times daily as needed for dizziness. 09/06/14   Harden Mo, MD  Olopatadine HCl 0.2 % SOLN Apply 1 drop to eye daily. 03/25/20   Loura Halt A, NP  ranitidine (ZANTAC) 150 MG capsule Take 1 capsule (150 mg total) by mouth 2 (two) times daily. 05/14/12 05/14/13  Melynda Ripple, MD    Family History No family history on file.  Social History Social History   Tobacco Use   Smoking status: Never   Smokeless tobacco: Never  Substance Use Topics   Alcohol use: Yes    Comment: occ    Drug use: No     Allergies   Cinnamon   Review of Systems Review of Systems  HENT: Negative.    Respiratory: Negative.    Cardiovascular: Negative.   Gastrointestinal:  Positive for abdominal pain, blood in stool, diarrhea and nausea. Negative for constipation and vomiting.  Musculoskeletal: Negative.     Physical Exam Triage Vital Signs ED Triage Vitals  Enc Vitals Group     BP 10/24/21 1046 137/86  Pulse Rate 10/24/21 1046 (!) 117     Resp 10/24/21 1046 17     Temp 10/24/21 1046 98.9 F (37.2 C)     Temp Source 10/24/21 1046 Oral     SpO2 10/24/21 1046 100 %     Weight --      Height --      Head Circumference --      Peak Flow --      Pain Score 10/24/21 1043 4     Pain Loc --      Pain Edu? --      Excl. in [place]? --    No data found.  Updated Vital Signs BP 137/86 (BP Location: Right Arm)   Pulse (!) 117   Temp 98.9 F (37.2 C) (Oral)   Resp 17   LMP 10/05/2021 (Exact Date)   SpO2 100%   Visual Acuity Right Eye Distance:   Left Eye Distance:   Bilateral Distance:    Right Eye Near:   Left Eye  Near:    Bilateral Near:     Physical Exam Vitals and nursing note reviewed.  Constitutional:      General: She is not in acute distress.    Appearance: She is not ill-appearing.  Cardiovascular:     Rate and Rhythm: Normal rate and regular rhythm.     Pulses: Normal pulses.     Heart sounds: Normal heart sounds.  Pulmonary:     Effort: Pulmonary effort is normal.     Breath sounds: Normal breath sounds.  Abdominal:     General: Bowel sounds are normal. There is no distension.     Tenderness: There is no abdominal tenderness. There is no guarding or rebound.  Neurological:     Mental Status: She is alert.     UC Treatments / Results  Labs (all labs ordered are listed, but only abnormal results are displayed) Labs Reviewed  GASTROINTESTINAL PANEL BY PCR, STOOL (REPLACES STOOL CULTURE)  C DIFFICILE QUICK SCREEN W PCR REFLEX      EKG   Radiology No results found.  Procedures Procedures (including critical care time)  Medications Ordered in UC Medications - No data to display  Initial Impression / Assessment and Plan / UC Course  I have reviewed the triage vital signs and the nursing notes.  Pertinent labs & imaging results that were available during my care of the patient were reviewed by me and considered in my medical decision making (see chart for details).     1.  Persistent diarrhea: C. difficile PCR GI PCR panel Increase oral fluid intake Probiotic and Zofran as needed We will call patient with results if labs are abnormal Return to urgent care if symptoms worsen. Final Clinical Impressions(s) / UC Diagnoses   Final diagnoses:  Diarrhea, unspecified type  Internal hemorrhoid, bleeding     Discharge Instructions      Increase oral fluid intake We will call you with recommendations if labs are abnormal Take medications as prescribed If you have abdominal pain or worsening diarrhea ,please return to urgent care to be reevaluated.   ED  Prescriptions     Medication Sig Dispense Auth. Provider   lactobacillus acidophilus & bulgar (LACTINEX) chewable tablet Chew 1 tablet by mouth 3 (three) times daily with meals. 30 tablet Khris Jansson, Myrene Galas, MD   ondansetron (ZOFRAN-ODT) 4 MG disintegrating tablet Take 1 tablet (4 mg total) by mouth every 8 (eight) hours as needed for nausea or vomiting. 20 tablet  Merina Behrendt, Myrene Galas, MD      PDMP not reviewed this encounter.   Chase Picket, MD 10/24/21 [phone]

## 2021-10-24 NOTE — ED Triage Notes (Signed)
Pt reports diarrhea since 2 Wed ago. Reports since Monday having bright red blood in stools. Hx hemorrhoid. Reports that family members in family informed pt may have c-diff.

## 2021-10-25 LAB — GASTROINTESTINAL PANEL BY PCR, STOOL (REPLACES STOOL CULTURE)

## 2021-12-29 ENCOUNTER — Other Ambulatory Visit: Payer: Self-pay

## 2022-04-11 IMAGING — US US PELVIS COMPLETE WITH TRANSVAGINAL
1 series · 14 of 25 positions shown · non-contrast
Comparison: CT scan 01/10/2021

CLINICAL DATA: Pelvic pain since last [REDACTED].

EXAM:
TRANSABDOMINAL AND TRANSVAGINAL ULTRASOUND OF PELVIS
TECHNIQUE: Both transabdominal and transvaginal ultrasound examinations of the
pelvis were performed. Transabdominal technique was performed for
global imaging of the pelvis including uterus, ovaries, adnexal
regions, and pelvic cul-de-sac. It was necessary to proceed with
endovaginal exam following the transabdominal exam to visualize the
ovaries and endometrium.

[Series 1: us pelvic complete w transvaginal and torsion righ · 14 of 66 slices shown]
[im 1/66]
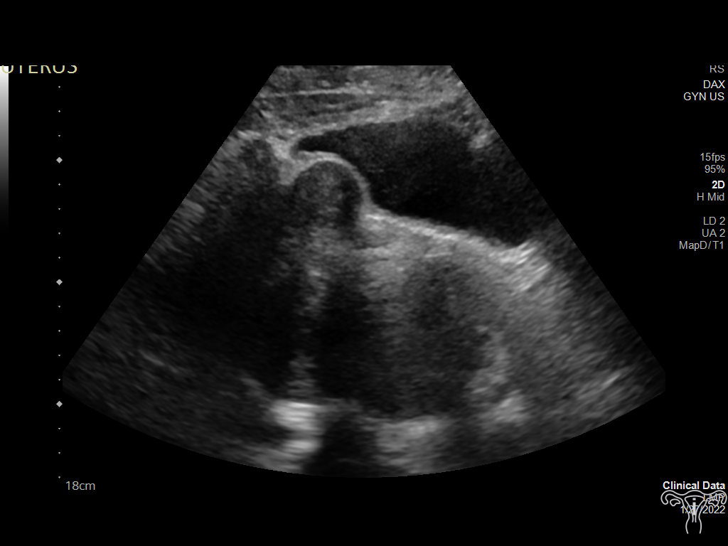
[im 6/66]
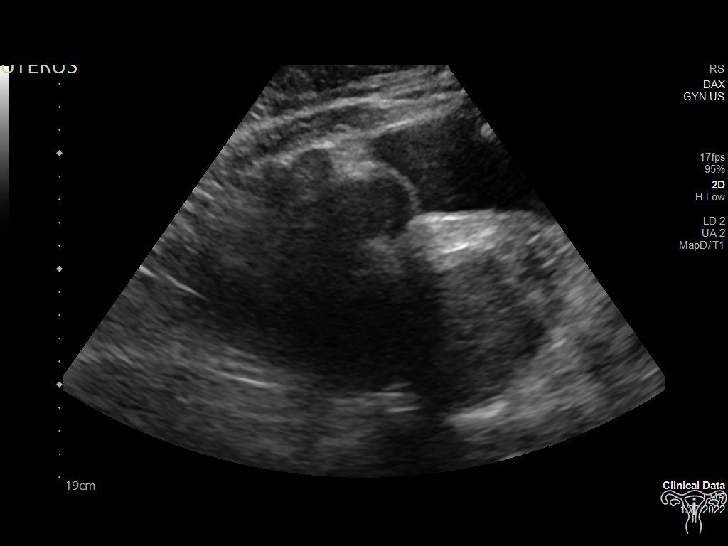
[im 11/66]
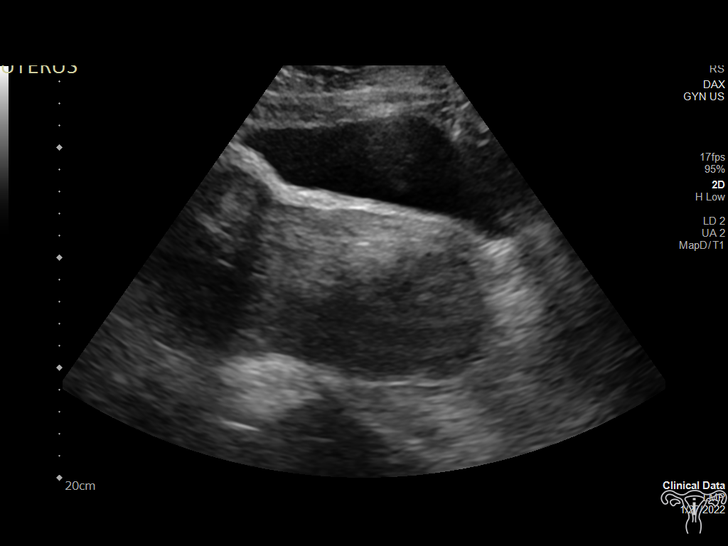
[im 17/66]
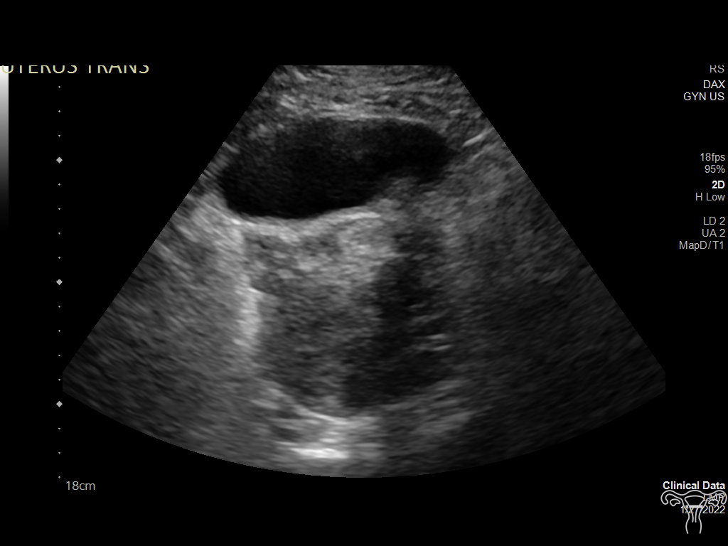
[im 22/66]
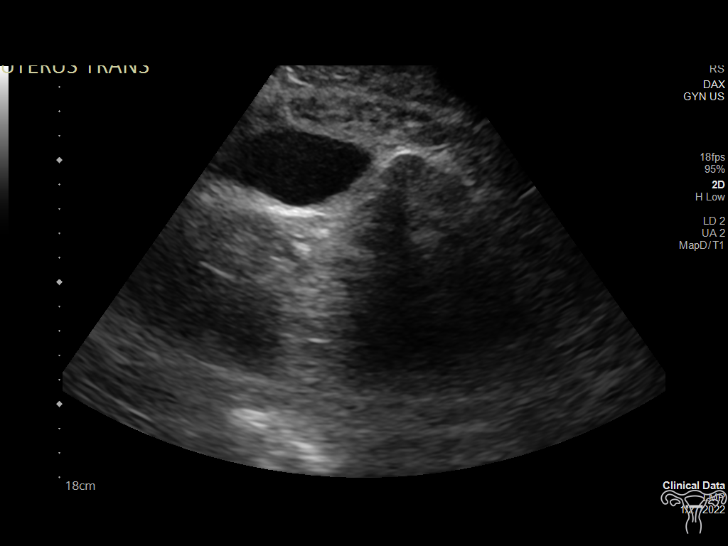
[im 25/66]
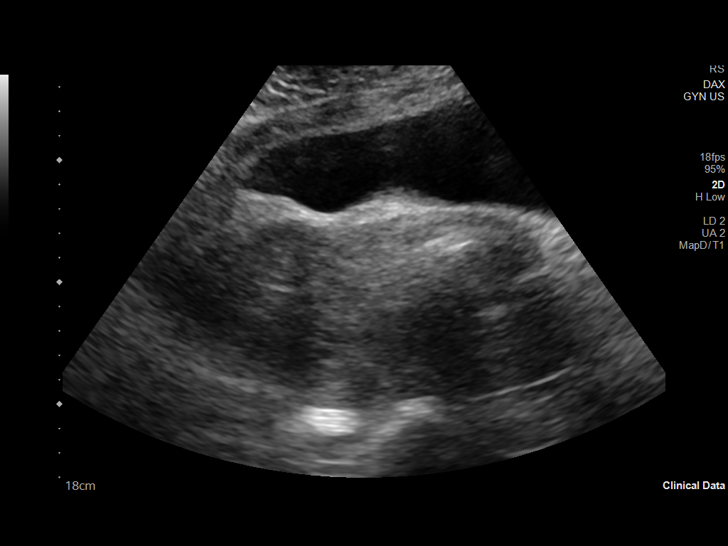
[im 30/66]
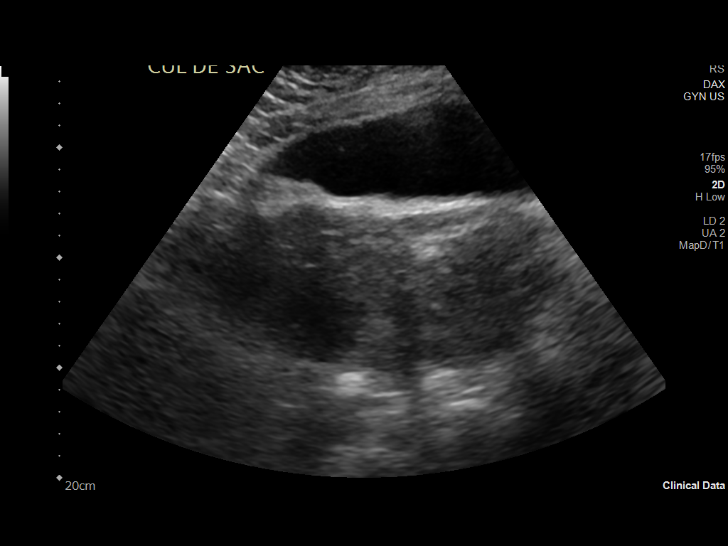
[im 36/66]
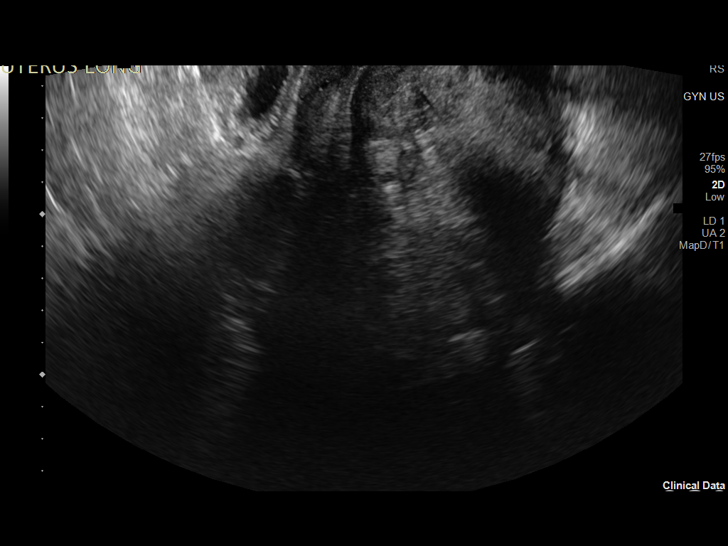
[im 41/66]
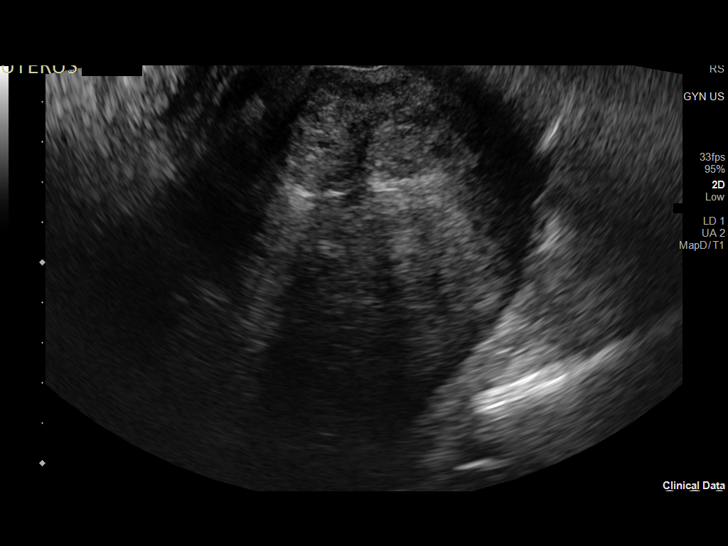
[im 44/66]
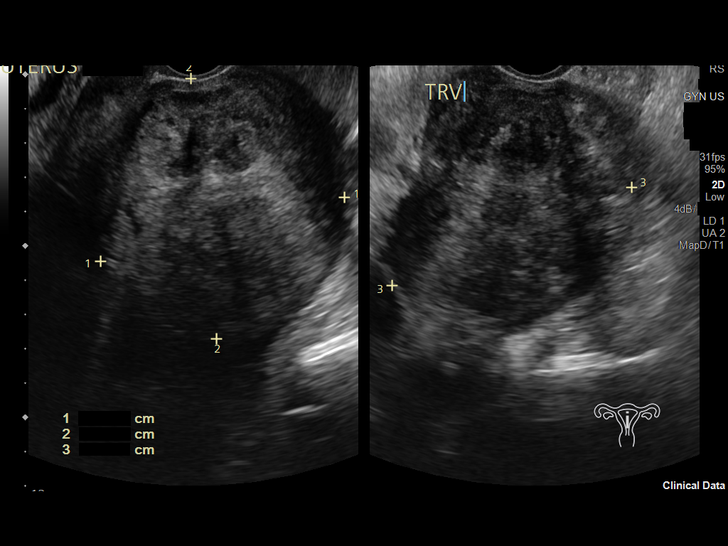
[im 49/66]
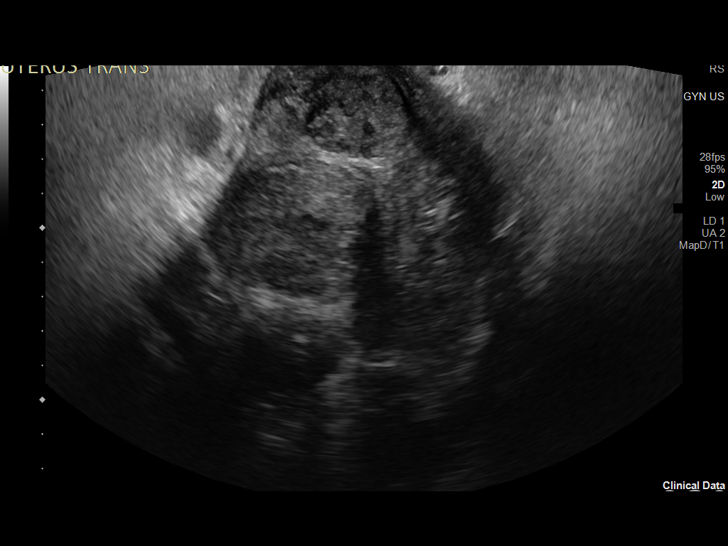
[im 55/66]
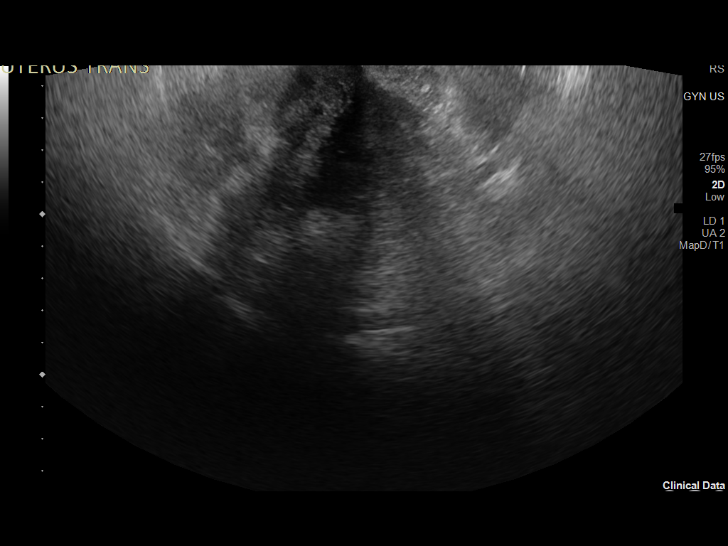
[im 60/66]
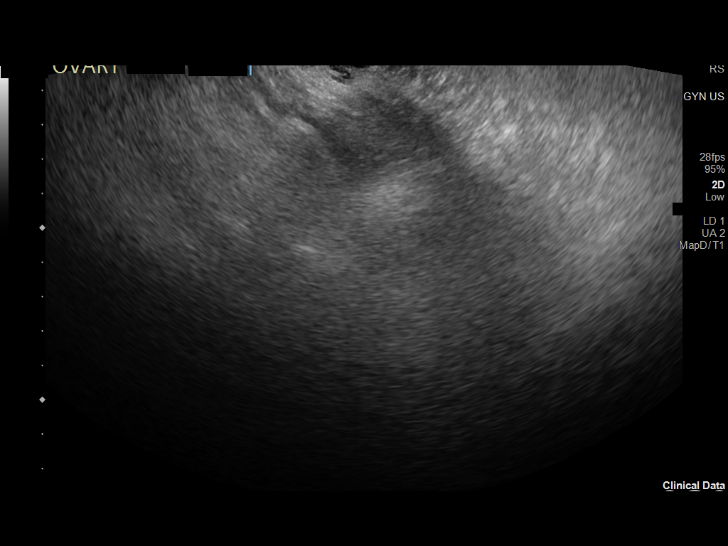
[im 66/66]
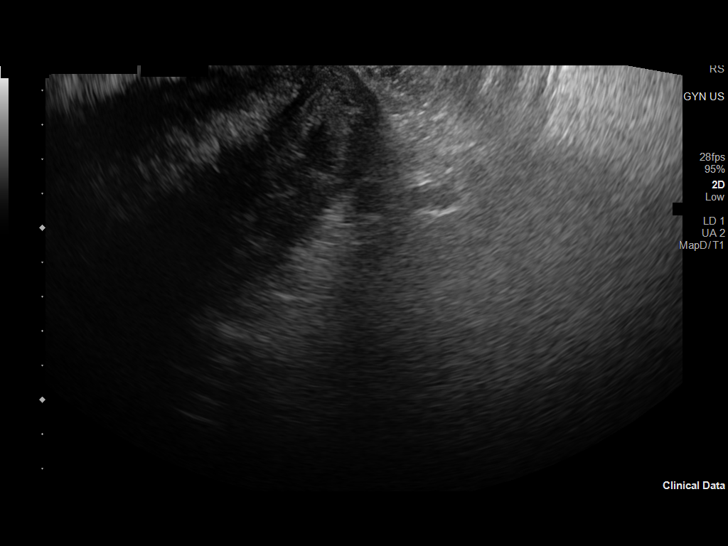

[14 of 25 positions shown; findings below may reference images not displayed]

FINDINGS: Uterus

Measurements: Difficult to measure by ultrasound. By CT it measures
20 x 28 x 9.4 cm. Multiple large fibroids. The largest measures
x 7.6 x 7.6 cm.

Endometrium

Thickness: Not visualized.

Right ovary

Measurements: Not visualized.

Left ovary

Measurements: Not visualized.

Other findings

No abnormal free fluid.
IMPRESSION: 1. Markedly enlarged uterus better evaluated on the CT scan.
Numerous large fibroids.
2. Ovaries could not be visualized.
3. No free pelvic fluid collections.

## 2022-05-30 DIAGNOSIS — K5100 Ulcerative (chronic) pancolitis without complications: Secondary | ICD-10-CM

## 2022-05-30 HISTORY — DX: Ulcerative (chronic) pancolitis without complications: K51.00

## 2022-06-19 ENCOUNTER — Ambulatory Visit (INDEPENDENT_AMBULATORY_CARE_PROVIDER_SITE_OTHER)
Admission: EM | Admit: 2022-06-19 | Discharge: 2022-06-19 | Disposition: A | Discharge status: Home / Self Care | Payer: 59 | Source: Home / Self Care

## 2022-06-19 ENCOUNTER — Encounter (HOSPITAL_COMMUNITY): Payer: Self-pay

## 2022-06-19 DIAGNOSIS — K2951 Unspecified chronic gastritis with bleeding: Secondary | ICD-10-CM | POA: Diagnosis not present

## 2022-06-19 DIAGNOSIS — K922 Gastrointestinal hemorrhage, unspecified: Secondary | ICD-10-CM | POA: Diagnosis not present

## 2022-06-19 DIAGNOSIS — R7982 Elevated C-reactive protein (CRP): Secondary | ICD-10-CM | POA: Diagnosis present

## 2022-06-19 DIAGNOSIS — M7989 Other specified soft tissue disorders: Secondary | ICD-10-CM | POA: Insufficient documentation

## 2022-06-19 DIAGNOSIS — Z79899 Other long term (current) drug therapy: Secondary | ICD-10-CM | POA: Diagnosis not present

## 2022-06-19 DIAGNOSIS — Z91018 Allergy to other foods: Secondary | ICD-10-CM | POA: Diagnosis not present

## 2022-06-19 DIAGNOSIS — D500 Iron deficiency anemia secondary to blood loss (chronic): Secondary | ICD-10-CM | POA: Diagnosis not present

## 2022-06-19 DIAGNOSIS — D509 Iron deficiency anemia, unspecified: Secondary | ICD-10-CM | POA: Diagnosis not present

## 2022-06-19 DIAGNOSIS — R21 Rash and other nonspecific skin eruption: Secondary | ICD-10-CM | POA: Insufficient documentation

## 2022-06-19 DIAGNOSIS — M25531 Pain in right wrist: Secondary | ICD-10-CM | POA: Diagnosis not present

## 2022-06-19 DIAGNOSIS — Z6841 Body Mass Index (BMI) 40.0 and over, adult: Secondary | ICD-10-CM | POA: Diagnosis not present

## 2022-06-19 DIAGNOSIS — T8089XA Other complications following infusion, transfusion and therapeutic injection, initial encounter: Secondary | ICD-10-CM | POA: Diagnosis not present

## 2022-06-19 DIAGNOSIS — N920 Excessive and frequent menstruation with regular cycle: Secondary | ICD-10-CM | POA: Diagnosis present

## 2022-06-19 DIAGNOSIS — D649 Anemia, unspecified: Secondary | ICD-10-CM | POA: Diagnosis not present

## 2022-06-19 DIAGNOSIS — R000 Tachycardia, unspecified: Secondary | ICD-10-CM | POA: Diagnosis not present

## 2022-06-19 DIAGNOSIS — D259 Leiomyoma of uterus, unspecified: Secondary | ICD-10-CM | POA: Diagnosis present

## 2022-06-19 DIAGNOSIS — K529 Noninfective gastroenteritis and colitis, unspecified: Secondary | ICD-10-CM | POA: Diagnosis not present

## 2022-06-19 DIAGNOSIS — D62 Acute posthemorrhagic anemia: Secondary | ICD-10-CM | POA: Diagnosis not present

## 2022-06-19 DIAGNOSIS — M25532 Pain in left wrist: Secondary | ICD-10-CM | POA: Diagnosis not present

## 2022-06-19 DIAGNOSIS — K921 Melena: Secondary | ICD-10-CM | POA: Diagnosis not present

## 2022-06-19 DIAGNOSIS — K219 Gastro-esophageal reflux disease without esophagitis: Secondary | ICD-10-CM | POA: Diagnosis present

## 2022-06-19 DIAGNOSIS — K2971 Gastritis, unspecified, with bleeding: Secondary | ICD-10-CM | POA: Diagnosis not present

## 2022-06-19 DIAGNOSIS — R700 Elevated erythrocyte sedimentation rate: Secondary | ICD-10-CM | POA: Diagnosis present

## 2022-06-19 DIAGNOSIS — Y848 Other medical procedures as the cause of abnormal reaction of the patient, or of later complication, without mention of misadventure at the time of the procedure: Secondary | ICD-10-CM | POA: Diagnosis not present

## 2022-06-19 DIAGNOSIS — K640 First degree hemorrhoids: Secondary | ICD-10-CM | POA: Diagnosis not present

## 2022-06-19 DIAGNOSIS — K51218 Ulcerative (chronic) proctitis with other complication: Secondary | ICD-10-CM | POA: Diagnosis not present

## 2022-06-19 DIAGNOSIS — K319 Disease of stomach and duodenum, unspecified: Secondary | ICD-10-CM | POA: Diagnosis not present

## 2022-06-19 DIAGNOSIS — K649 Unspecified hemorrhoids: Secondary | ICD-10-CM | POA: Diagnosis not present

## 2022-06-19 DIAGNOSIS — K51011 Ulcerative (chronic) pancolitis with rectal bleeding: Secondary | ICD-10-CM | POA: Diagnosis not present

## 2022-06-19 DIAGNOSIS — K6389 Other specified diseases of intestine: Secondary | ICD-10-CM | POA: Diagnosis not present

## 2022-06-19 DIAGNOSIS — K3189 Other diseases of stomach and duodenum: Secondary | ICD-10-CM | POA: Diagnosis not present

## 2022-06-19 DIAGNOSIS — K633 Ulcer of intestine: Secondary | ICD-10-CM | POA: Diagnosis not present

## 2022-06-19 DIAGNOSIS — F419 Anxiety disorder, unspecified: Secondary | ICD-10-CM | POA: Diagnosis present

## 2022-06-19 LAB — BASIC METABOLIC PANEL
Anion gap: 9 (ref 5–15)
BUN: 7 mg/dL (ref 6–20)
CO2: 21 mmol/L — ABNORMAL LOW (ref 22–32)
Calcium: 9.1 mg/dL (ref 8.9–10.3)
Chloride: 108 mmol/L (ref 98–111)
Creatinine, Ser: 0.63 mg/dL (ref 0.44–1.00)
GFR, Estimated: >60 mL/min (ref >60)
Glucose, Bld: 88 mg/dL (ref 70–99)
Potassium: 3.4 mmol/L — ABNORMAL LOW (ref 3.5–5.1)
Sodium: 138 mmol/L (ref 135–145)

## 2022-06-19 LAB — CBC
HCT: 25.1 % — ABNORMAL LOW (ref 36.0–46.0)
Hemoglobin: 6.7 g/dL — CL (ref 12.0–15.0)
MCH: 16.6 pg — ABNORMAL LOW (ref 26.0–34.0)
MCHC: 26.7 g/dL — ABNORMAL LOW (ref 30.0–36.0)
MCV: 62.3 fL — ABNORMAL LOW (ref 80.0–100.0)
Platelets: 402 10*3/uL — ABNORMAL HIGH (ref 150–400)
RBC: 4.03 MIL/uL (ref 3.87–5.11)
RDW: 21.9 % — ABNORMAL HIGH (ref 11.5–15.5)
WBC: 11.3 10*3/uL — ABNORMAL HIGH (ref 4.0–10.5)
nRBC: 0 % (ref 0.0–0.2)

## 2022-06-19 LAB — C-REACTIVE PROTEIN: CRP: 2.5 mg/dL — ABNORMAL HIGH (ref <1.0)

## 2022-06-19 LAB — SEDIMENTATION RATE: Sed Rate: 62 mm/hr — ABNORMAL HIGH (ref 0–22)

## 2022-06-19 MED ORDER — IBUPROFEN 600 MG PO TABS: 600.0000 mg | ORAL_TABLET | Freq: Four times a day (QID) | ORAL | 0 refills | Status: DC | PRN

## 2022-06-19 MED ORDER — KETOROLAC TROMETHAMINE 30 MG/ML IJ SOLN
INTRAMUSCULAR | Status: AC
  Filled 2022-06-19: qty 1

## 2022-06-19 MED ORDER — KETOROLAC TROMETHAMINE 30 MG/ML IJ SOLN
30.0000 mg | Freq: Once | INTRAMUSCULAR | Status: AC
Start: 2022-06-19 — End: 2022-06-19
  Administered 2022-06-19: 30 mg via INTRAMUSCULAR

## 2022-06-19 MED ORDER — ACETAMINOPHEN 325 MG PO TABS
ORAL_TABLET | ORAL | Status: AC
  Filled 2022-06-19: qty 3

## 2022-06-19 MED ORDER — ACETAMINOPHEN 325 MG PO TABS
975.0000 mg | ORAL_TABLET | Freq: Once | ORAL | Status: AC
  Administered 2022-06-19: 975 mg via ORAL

## 2022-06-19 MED ORDER — TRIAMCINOLONE ACETONIDE 0.5 % EX OINT: 1.0000 | TOPICAL_OINTMENT | Freq: Two times a day (BID) | CUTANEOUS | 0 refills | Status: DC

## 2022-06-19 NOTE — ED Triage Notes (Signed)
Rt hand pain and swelling. No known injury. Pt states for the past 2 weeks she has been having red places and pain in her legs.

## 2022-06-19 NOTE — Discharge Instructions (Addendum)
You were given ketorolac injection and Tylenol in the clinic today for your right hand pain and swelling.  Do not take any more ibuprofen or Tylenol until tomorrow morning since you were given medications in the clinic tonight.  I have drawn labs to assess your blood levels, electrolytes, and inflammation levels.  Go to your PCP appointment on Monday as scheduled for follow-up on this.   The rash to your abdomen looks inflammatory in nature.  You may attempt use of triamcinolone cream twice daily for the next 7 to 10 days to treat the rash.  Do not use this for more than 14 days as it is a steroid medication.   Follow-up with urgent care as needed.  Follow-up with primary care on Monday as scheduled.  If you develop any new or worsening symptoms that are severe, please go to the emergency room for further evaluation and management.

## 2022-06-19 NOTE — ED Provider Notes (Signed)
[name]    CSN: [id number] Arrival date & time: 06/19/22  1836      History   Chief Complaint Chief Complaint  Patient presents with   Hand Problem    HPI Dana Campbell is a 40 y.o. female.   Patient presents to urgent care for evaluation of right hand swelling that started this morning upon waking.  She is unsure if she "slept on her right hand wrong".  She denies recent insect bites, injury/trauma to the right hand, and history of joint swelling in the past.  No recent travel, body aches, URI symptoms, or fever/chills.  Patient also reports that she has been noticing what appears to be bruises to her bilateral lower extremities over the last couple of weeks that will "fade away on their own after short amount of time".  She has a singular lesion to her abdomen that is similar to the lesions that were previously present to her legs.  She noticed the lesion to her abdomen couple of days ago and states that it is very red and itchy.  Patient denies recent exposure to new laundry detergents and bedding, but states that she has changed her body wash recently.  No recent exposure to known poisonous plants or any other irritants or allergens that she is aware of.  Reporting associated episodic shortness of breath, fatigue, and dizziness intermittently over the last couple of weeks as well.  She denies shortness of breath, chest pain, fatigue, dizziness, and headache at this time.  No abdominal pain, nausea, vomiting, urinary symptoms, vaginal discharge, significant recent weight loss, night sweats, and chills.  The dorsal aspect of the right hand is very swollen without known injury or trauma.  Patient denies history of gouty arthritis and injury to the right hand.  Right hand pain is a 6 on a scale of 0-10 at this time and she states that it is a dull and constant ache.  Patient has been using ibuprofen and Tylenol intermittently for for pain and swelling without much relief.   Patient states that she has a primary care appointment scheduled for Monday, June 22, 2022 but states that her symptoms are very concerning to her and wanting to be seen sooner than Monday.  Denies sick contacts and recent antibiotic use.  No recent changes to her medications reported.  She does not have any chronic illnesses requiring her to take daily medications.     History reviewed. No pertinent past medical history.  There are no problems to display for this patient.   History reviewed. No pertinent surgical history.  OB History     Gravida  0   Para  0   Term  0   Preterm  0   AB  0   Living  0      SAB  0   IAB  0   Ectopic  0   Multiple  0   Live Births  0            Home Medications    Prior to Admission medications   Medication Sig Start Date End Date Taking? Authorizing Provider  cetirizine (ZYRTEC) 10 MG tablet Take 1 tablet (10 mg total) by mouth daily. 03/25/20  Yes Bast, Traci A, NP  ibuprofen (ADVIL) 600 MG tablet Take 1 tablet (600 mg total) by mouth every 6 (six) hours as needed. 06/19/22  Yes Talbot Grumbling, FNP  triamcinolone ointment (KENALOG) 0.5 % Apply 1 Application  topically 2 (two) times daily. 06/19/22  Yes Talbot Grumbling, FNP  acetaminophen (TYLENOL) 325 MG tablet Take 650 mg by mouth every 6 (six) hours as needed.    [provider]  benzonatate (TESSALON) 100 MG capsule Take 1 capsule (100 mg total) by mouth every 8 (eight) hours. 09/15/18   Wurst, [place], PA-C  doxycycline (VIBRAMYCIN) 100 MG capsule Take 1 capsule (100 mg total) by mouth 2 (two) times daily. 01/10/21   Volanda Napoleon, PA-C  HYDROcodone-acetaminophen (NORCO/VICODIN) 5-325 MG tablet Take 1-2 tablets by mouth every 6 (six) hours as needed. 01/10/21   Volanda Napoleon, PA-C  hydrocortisone (ANUSOL-HC) 25 MG suppository Place 1 suppository (25 mg total) rectally 2 (two) times daily. 06/30/21   LampteyMyrene Galas, MD  lactobacillus acidophilus &  bulgar (LACTINEX) chewable tablet Chew 1 tablet by mouth 3 (three) times daily with meals. 10/24/21   Chase Picket, MD  meclizine (ANTIVERT) 25 MG tablet Take 1 tablet (25 mg total) by mouth 3 (three) times daily as needed for dizziness. 09/06/14   Harden Mo, MD  Olopatadine HCl 0.2 % SOLN Apply 1 drop to eye daily. 03/25/20   Bast, Tressia Miners A, NP  ondansetron (ZOFRAN-ODT) 4 MG disintegrating tablet Take 1 tablet (4 mg total) by mouth every 8 (eight) hours as needed for nausea or vomiting. 10/24/21   LampteyMyrene Galas, MD  ranitidine (ZANTAC) 150 MG capsule Take 1 capsule (150 mg total) by mouth 2 (two) times daily. 05/14/12 05/14/13  Melynda Ripple, MD    Family History History reviewed. No pertinent family history.  Social History Social History   Tobacco Use   Smoking status: Never   Smokeless tobacco: Never  Vaping Use   Vaping Use: Never used  Substance Use Topics   Alcohol use: Yes    Comment: occ    Drug use: No     Allergies   Cinnamon   Review of Systems Review of Systems Per HPI  Physical Exam Triage Vital Signs ED Triage Vitals  Enc Vitals Group     BP 06/19/22 1917 139/88     Pulse Rate 06/19/22 1917 (!) 117     Resp 06/19/22 1917 20     Temp 06/19/22 1917 99.2 F (37.3 C)     Temp Source 06/19/22 1917 Oral     SpO2 06/19/22 1917 99 %     Weight --      Height --      Head Circumference --      Peak Flow --      Pain Score 06/19/22 1913 6     Pain Loc --      Pain Edu? --      Excl. in [place]? --    No data found.  Updated Vital Signs BP 139/88 (BP Location: Left Arm)   Pulse (!) 117   Temp 99.2 F (37.3 C) (Oral)   Resp 20   LMP 05/29/2022   SpO2 99%   Visual Acuity Right Eye Distance:   Left Eye Distance:   Bilateral Distance:    Right Eye Near:   Left Eye Near:    Bilateral Near:     Physical Exam Vitals and nursing note reviewed.  Constitutional:      Appearance: Normal appearance. She is obese. She is not ill-appearing or  toxic-appearing.     Comments: Very pleasant patient sitting on exam in position of comfort table in no acute distress.   HENT:  Head: Normocephalic and atraumatic.     Right Ear: Hearing and external ear normal.     Left Ear: Hearing and external ear normal.     Nose: Nose normal.     Mouth/Throat:     Lips: Pink.     Mouth: Mucous membranes are moist.     Pharynx: No posterior oropharyngeal erythema.  Eyes:     General: Lids are normal. Vision grossly intact. Gaze aligned appropriately.     Extraocular Movements: Extraocular movements intact.     Conjunctiva/sclera: Conjunctivae normal.     Pupils: Pupils are equal, round, and reactive to light.  Cardiovascular:     Rate and Rhythm: Normal rate and regular rhythm.     Heart sounds: Normal heart sounds.  Pulmonary:     Effort: Pulmonary effort is normal.  Abdominal:     Palpations: Abdomen is soft.  Musculoskeletal:     Cervical back: Neck supple.     Comments: Right upper extremity: Significant swelling present to the dorsal aspect of the patient's right hand.  +2 right radial pulse present.  Capillary refill is less than 3 to the right upper extremity.  Sensation is intact.  No evidence of trauma/injury, ecchymosis, or deformity.  Normal range of motion present to the right hand and patient is able to make a grip motion with the right hand.  See image below of right hand for further detail.  Lymphadenopathy:     Cervical: No cervical adenopathy.  Skin:    General: Skin is warm and dry.     Capillary Refill: Capillary refill takes less than 2 seconds.     Findings: No rash.     Comments: Lesion that is approximately 3 to 4 cm in diameter with abnormal borders present to the abdomen.  Lesion is erythematous, itchy, and irritated appearing.  There is no warmth or swelling to the lesion.  See image below for further detail.  Neurological:     General: No focal deficit present.     Mental Status: She is alert and oriented to  person, place, and time. Mental status is at baseline.     Cranial Nerves: No dysarthria or facial asymmetry.     Gait: Gait is intact.  Psychiatric:        Mood and Affect: Mood normal.        Speech: Speech normal.        Behavior: Behavior normal.        Thought Content: Thought content normal.        Judgment: Judgment normal.     Right hand swelling   Abdominal lesion    UC Treatments / Results  Labs (all labs ordered are listed, but only abnormal results are displayed) Labs Reviewed  CBC  BASIC METABOLIC PANEL  SEDIMENTATION RATE  C-REACTIVE PROTEIN    EKG   Radiology No results found.  Procedures Procedures (including critical care time)  Medications Ordered in UC Medications  ketorolac (TORADOL) 30 MG/ML injection 30 mg (has no administration in time range)  acetaminophen (TYLENOL) tablet 975 mg (has no administration in time range)    Initial Impression / Assessment and Plan / UC Course  I have reviewed the triage vital signs and the nursing notes.  Pertinent labs & imaging results that were available during my care of the patient were reviewed by me and considered in my medical decision making (see chart for details).  1.  Rash and nonspecific skin eruption Lesion to the abdomen  appears inflammatory in nature.  Plan to draw basic labs and CRP/ESR to investigate for possible systemic inflammatory syndrome due to subjective history of similar lesions to the bilateral lower extremities. Will call patient with abnormal results.  Patient to continue follow-up with PCP as scheduled on Monday, June 22, 2022.  She may use triamcinolone ointment to the rash on her abdomen for inflammation and itch or no greater than 10 to 14 days as this is a steroid medication.   2.  Swelling of right hand Unknown etiology of swelling of right hand.  Patient given ketorolac injection in clinic for pain management as well as 975 mg of Tylenol.  She may begin using ibuprofen 600  mg every 6 hours starting tomorrow morning for right hand swelling and pain.  Recommend rest, ice, elevation, and compression to the right hand over the next few days to reduce swelling.  No known injury to the right hand and thus do not recommend imaging today due to very low suspicion for acute bony abnormality.  Hand is not warm or red when patient does not have a history of gouty arthritis.  Patient is agreeable with this plan and plans to discuss this further with her PCP on June 22, 2022 at her scheduled appointment.   Discussed physical exam and available lab work findings in clinic with patient.  Counseled patient regarding appropriate use of medications and potential side effects for all medications recommended or prescribed today. Discussed red flag signs and symptoms of worsening condition,when to call the PCP office, return to urgent care, and when to seek higher level of care in the emergency department. Patient verbalizes understanding and agreement with plan. All questions answered. Patient discharged in stable condition.  Final Clinical Impressions(s) / UC Diagnoses   Final diagnoses:  Swelling of right hand  Rash and nonspecific skin eruption     Discharge Instructions      You were given ketorolac injection and Tylenol in the clinic today for your right hand pain and swelling.  Do not take any more ibuprofen or Tylenol until tomorrow morning since you were given medications in the clinic tonight.  I have drawn labs to assess your blood levels, electrolytes, and inflammation levels.  Go to your PCP appointment on Monday as scheduled for follow-up on this.   The rash to your abdomen looks inflammatory in nature.  You may attempt use of triamcinolone cream twice daily for the next 7 to 10 days to treat the rash.  Do not use this for more than 14 days as it is a steroid medication.   Follow-up with urgent care as needed.  Follow-up with primary care on Monday as scheduled.  If you  develop any new or worsening symptoms that are severe, please go to the emergency room for further evaluation and management.     ED Prescriptions     Medication Sig Dispense Auth. Provider   triamcinolone ointment (KENALOG) 0.5 % Apply 1 Application topically 2 (two) times daily. 30 g Joella Prince M, FNP   ibuprofen (ADVIL) 600 MG tablet Take 1 tablet (600 mg total) by mouth every 6 (six) hours as needed. 30 tablet Talbot Grumbling, FNP      PDMP not reviewed this encounter.   Talbot Grumbling, [place] 06/20/22 2125

## 2022-06-20 ENCOUNTER — Inpatient Hospital Stay (HOSPITAL_COMMUNITY): Payer: 59

## 2022-06-20 ENCOUNTER — Telehealth (HOSPITAL_COMMUNITY): Payer: Self-pay | Admitting: Internal Medicine

## 2022-06-20 ENCOUNTER — Other Ambulatory Visit: Payer: Self-pay

## 2022-06-20 ENCOUNTER — Encounter (HOSPITAL_COMMUNITY): Payer: Self-pay

## 2022-06-20 ENCOUNTER — Inpatient Hospital Stay (HOSPITAL_COMMUNITY)
Admission: EM | Admit: 2022-06-20 | Discharge: 2022-06-24 | DRG: 385 | Disposition: A | Discharge status: Home / Self Care | Payer: 59 | Attending: Internal Medicine | Admitting: Internal Medicine

## 2022-06-20 DIAGNOSIS — M25532 Pain in left wrist: Secondary | ICD-10-CM | POA: Diagnosis not present

## 2022-06-20 DIAGNOSIS — D500 Iron deficiency anemia secondary to blood loss (chronic): Secondary | ICD-10-CM | POA: Diagnosis not present

## 2022-06-20 DIAGNOSIS — K640 First degree hemorrhoids: Secondary | ICD-10-CM | POA: Diagnosis present

## 2022-06-20 DIAGNOSIS — D509 Iron deficiency anemia, unspecified: Secondary | ICD-10-CM | POA: Diagnosis present

## 2022-06-20 DIAGNOSIS — R700 Elevated erythrocyte sedimentation rate: Secondary | ICD-10-CM | POA: Diagnosis present

## 2022-06-20 DIAGNOSIS — K649 Unspecified hemorrhoids: Secondary | ICD-10-CM | POA: Diagnosis not present

## 2022-06-20 DIAGNOSIS — Z6841 Body Mass Index (BMI) 40.0 and over, adult: Secondary | ICD-10-CM | POA: Diagnosis not present

## 2022-06-20 DIAGNOSIS — D62 Acute posthemorrhagic anemia: Secondary | ICD-10-CM | POA: Diagnosis present

## 2022-06-20 DIAGNOSIS — K529 Noninfective gastroenteritis and colitis, unspecified: Secondary | ICD-10-CM | POA: Diagnosis not present

## 2022-06-20 DIAGNOSIS — K219 Gastro-esophageal reflux disease without esophagitis: Secondary | ICD-10-CM | POA: Diagnosis present

## 2022-06-20 DIAGNOSIS — D259 Leiomyoma of uterus, unspecified: Secondary | ICD-10-CM | POA: Diagnosis present

## 2022-06-20 DIAGNOSIS — M25531 Pain in right wrist: Secondary | ICD-10-CM | POA: Diagnosis not present

## 2022-06-20 DIAGNOSIS — K51218 Ulcerative (chronic) proctitis with other complication: Secondary | ICD-10-CM | POA: Diagnosis not present

## 2022-06-20 DIAGNOSIS — T8089XA Other complications following infusion, transfusion and therapeutic injection, initial encounter: Secondary | ICD-10-CM | POA: Diagnosis not present

## 2022-06-20 DIAGNOSIS — A09 Infectious gastroenteritis and colitis, unspecified: Secondary | ICD-10-CM | POA: Diagnosis present

## 2022-06-20 DIAGNOSIS — N920 Excessive and frequent menstruation with regular cycle: Secondary | ICD-10-CM | POA: Diagnosis present

## 2022-06-20 DIAGNOSIS — D649 Anemia, unspecified: Secondary | ICD-10-CM

## 2022-06-20 DIAGNOSIS — K922 Gastrointestinal hemorrhage, unspecified: Secondary | ICD-10-CM | POA: Diagnosis present

## 2022-06-20 DIAGNOSIS — F419 Anxiety disorder, unspecified: Secondary | ICD-10-CM | POA: Diagnosis present

## 2022-06-20 DIAGNOSIS — R7982 Elevated C-reactive protein (CRP): Secondary | ICD-10-CM | POA: Diagnosis present

## 2022-06-20 DIAGNOSIS — Z8719 Personal history of other diseases of the digestive system: Secondary | ICD-10-CM

## 2022-06-20 DIAGNOSIS — K51011 Ulcerative (chronic) pancolitis with rectal bleeding: Secondary | ICD-10-CM | POA: Diagnosis present

## 2022-06-20 DIAGNOSIS — R21 Rash and other nonspecific skin eruption: Secondary | ICD-10-CM | POA: Diagnosis not present

## 2022-06-20 DIAGNOSIS — K2971 Gastritis, unspecified, with bleeding: Secondary | ICD-10-CM | POA: Diagnosis present

## 2022-06-20 DIAGNOSIS — Z91018 Allergy to other foods: Secondary | ICD-10-CM | POA: Diagnosis not present

## 2022-06-20 DIAGNOSIS — Z79899 Other long term (current) drug therapy: Secondary | ICD-10-CM | POA: Diagnosis not present

## 2022-06-20 DIAGNOSIS — E66813 Obesity, class 3: Secondary | ICD-10-CM

## 2022-06-20 DIAGNOSIS — Y848 Other medical procedures as the cause of abnormal reaction of the patient, or of later complication, without mention of misadventure at the time of the procedure: Secondary | ICD-10-CM | POA: Diagnosis not present

## 2022-06-20 DIAGNOSIS — M7989 Other specified soft tissue disorders: Secondary | ICD-10-CM

## 2022-06-20 HISTORY — DX: Personal history of other diseases of the digestive system: Z87.19

## 2022-06-20 LAB — CBC WITH DIFFERENTIAL/PLATELET
Abs Immature Granulocytes: 0.03 10*3/uL (ref 0.00–0.07)
Basophils Absolute: 0 10*3/uL (ref 0.0–0.1)
Basophils Relative: 0 %
Eosinophils Absolute: 0.4 10*3/uL (ref 0.0–0.5)
Eosinophils Relative: 5 %
HCT: 23 % — ABNORMAL LOW (ref 36.0–46.0)
Hemoglobin: 6.1 g/dL — CL (ref 12.0–15.0)
Immature Granulocytes: 0 %
Lymphocytes Relative: 10 %
Lymphs Abs: 0.9 10*3/uL (ref 0.7–4.0)
MCH: 16.8 pg — ABNORMAL LOW (ref 26.0–34.0)
MCHC: 26.5 g/dL — ABNORMAL LOW (ref 30.0–36.0)
MCV: 63.2 fL — ABNORMAL LOW (ref 80.0–100.0)
Monocytes Absolute: 0.7 10*3/uL (ref 0.1–1.0)
Monocytes Relative: 8 %
Neutro Abs: 6.8 10*3/uL (ref 1.7–7.7)
Neutrophils Relative %: 77 %
Platelets: 365 10*3/uL (ref 150–400)
RBC: 3.64 MIL/uL — ABNORMAL LOW (ref 3.87–5.11)
RDW: 21.9 % — ABNORMAL HIGH (ref 11.5–15.5)
WBC: 8.8 10*3/uL (ref 4.0–10.5)
nRBC: 0 % (ref 0.0–0.2)

## 2022-06-20 LAB — COMPREHENSIVE METABOLIC PANEL
ALT: 7 U/L (ref 0–44)
AST: 17 U/L (ref 15–41)
Albumin: 3.4 g/dL — ABNORMAL LOW (ref 3.5–5.0)
Alkaline Phosphatase: 52 U/L (ref 38–126)
Anion gap: 6 (ref 5–15)
BUN: 10 mg/dL (ref 6–20)
CO2: 21 mmol/L — ABNORMAL LOW (ref 22–32)
Calcium: 8.5 mg/dL — ABNORMAL LOW (ref 8.9–10.3)
Chloride: 109 mmol/L (ref 98–111)
Creatinine, Ser: 0.76 mg/dL (ref 0.44–1.00)
GFR, Estimated: >60 mL/min (ref >60)
Glucose, Bld: 103 mg/dL — ABNORMAL HIGH (ref 70–99)
Potassium: 3.5 mmol/L (ref 3.5–5.1)
Sodium: 136 mmol/L (ref 135–145)
Total Bilirubin: 0.4 mg/dL (ref 0.3–1.2)
Total Protein: 8.1 g/dL (ref 6.5–8.1)

## 2022-06-20 LAB — IRON AND TIBC
Iron: 15 ug/dL — ABNORMAL LOW (ref 28–170)
Saturation Ratios: 4 % — ABNORMAL LOW (ref 10.4–31.8)
TIBC: 350 ug/dL (ref 250–450)
UIBC: 335 ug/dL

## 2022-06-20 LAB — URIC ACID: Uric Acid, Serum: 3.3 mg/dL (ref 2.5–7.1)

## 2022-06-20 LAB — PROTIME-INR
INR: 1.2 (ref 0.8–1.2)
Prothrombin Time: 15.4 seconds — ABNORMAL HIGH (ref 11.4–15.2)

## 2022-06-20 LAB — OCCULT BLOOD X 1 CARD TO LAB, STOOL: Fecal Occult Bld: POSITIVE — AB

## 2022-06-20 LAB — FOLATE: Folate: 15.9 ng/mL (ref >5.9)

## 2022-06-20 LAB — BRAIN NATRIURETIC PEPTIDE: B Natriuretic Peptide: 25.6 pg/mL (ref 0.0–100.0)

## 2022-06-20 LAB — ABO/RH: ABO/RH(D): O POS

## 2022-06-20 LAB — VITAMIN B12: Vitamin B-12: 182 pg/mL (ref 180–914)

## 2022-06-20 LAB — RETICULOCYTES
Immature Retic Fract: 21.4 % — ABNORMAL HIGH (ref 2.3–15.9)
RBC.: 3.74 MIL/uL — ABNORMAL LOW (ref 3.87–5.11)
Retic Count, Absolute: 62.5 10*3/uL (ref 19.0–186.0)
Retic Ct Pct: 1.7 % (ref 0.4–3.1)

## 2022-06-20 LAB — SEDIMENTATION RATE: Sed Rate: 68 mm/hr — ABNORMAL HIGH (ref 0–22)

## 2022-06-20 LAB — HCG, QUANTITATIVE, PREGNANCY: hCG, Beta Chain, Quant, S: <1 m[IU]/mL (ref <5)

## 2022-06-20 LAB — FERRITIN: Ferritin: 10 ng/mL — ABNORMAL LOW (ref 11–307)

## 2022-06-20 LAB — PREPARE RBC (CROSSMATCH)

## 2022-06-20 MED ORDER — LORATADINE 10 MG PO TABS
10.0000 mg | ORAL_TABLET | Freq: Every day | ORAL | Status: DC
  Administered 2022-06-21 – 2022-06-24 (×4): 10 mg via ORAL
  Filled 2022-06-20 (×4): qty 1

## 2022-06-20 MED ORDER — PANTOPRAZOLE 80MG IVPB - SIMPLE MED
80.0000 mg | Freq: Once | INTRAVENOUS | Status: AC
  Administered 2022-06-20: 80 mg via INTRAVENOUS
  Filled 2022-06-20: qty 80

## 2022-06-20 MED ORDER — PANTOPRAZOLE SODIUM 40 MG IV SOLR
40.0000 mg | Freq: Once | INTRAVENOUS | Status: AC
Start: 2022-06-20 — End: 2022-06-20
  Administered 2022-06-20: 40 mg via INTRAVENOUS
  Filled 2022-06-20: qty 10

## 2022-06-20 MED ORDER — ACETAMINOPHEN 650 MG RE SUPP: 650.0000 mg | Freq: Four times a day (QID) | RECTAL | Status: DC | PRN

## 2022-06-20 MED ORDER — ACETAMINOPHEN 325 MG PO TABS: 650.0000 mg | ORAL_TABLET | Freq: Four times a day (QID) | ORAL | Status: DC | PRN

## 2022-06-20 MED ORDER — PANTOPRAZOLE INFUSION (NEW) - SIMPLE MED
8.0000 mg/h | INTRAVENOUS | Status: DC
  Administered 2022-06-20: 8 mg/h via INTRAVENOUS
  Filled 2022-06-20: qty 100
  Filled 2022-06-20: qty 80

## 2022-06-20 MED ORDER — SODIUM CHLORIDE 0.9 % IV SOLN
10.0000 mL/h | Freq: Once | INTRAVENOUS | Status: AC
  Administered 2022-06-20: 10 mL/h via INTRAVENOUS

## 2022-06-20 MED ORDER — OXYCODONE HCL 5 MG PO TABS
5.0000 mg | ORAL_TABLET | ORAL | Status: DC | PRN
  Administered 2022-06-21 – 2022-06-24 (×11): 5 mg via ORAL
  Filled 2022-06-20 (×11): qty 1

## 2022-06-20 MED ORDER — PANTOPRAZOLE SODIUM 40 MG PO TBEC
40.0000 mg | DELAYED_RELEASE_TABLET | Freq: Two times a day (BID) | ORAL | Status: DC
  Administered 2022-06-21 (×2): 40 mg via ORAL
  Filled 2022-06-20 (×2): qty 1

## 2022-06-20 NOTE — ED Provider Notes (Signed)
[facility] DEPT Provider Note   CSN: [id number] Arrival date & time: 06/20/22  1220     History  Chief Complaint  Patient presents with   Abnormal Lab    Low hgb   Shortness of Breath   Dizziness   Leg Swelling    Dana Campbell Else is a 41 y.o. female with no known past medical conditions presenting today with anemia.  Reports that yesterday she went to a urgent care because she woke up with right hand pain and in her lab work they noted her hemoglobin to be 6.7.  They called her this morning and told her to come to the ED.  She says that she has been dizzy, with some palpitations, lightheadedness and some shortness of breath on exertion.  She noticed some bilateral lower extremity swelling, no recent travel, surgery, OCP use, tobacco use.  Says she has not noticed any dark stools however she says occasionally she has bright red blood secondary to hemorrhoids.  She has not had any of this bleeding recently.  Last menstrual period ended 6/30.  No thinners, NSAIDs or EtOH.  Of note patient reports recent "splotches" on her abdomen and posterior knees   Abnormal Lab Shortness of Breath Dizziness Associated symptoms: shortness of breath        Home Medications Prior to Admission medications   Medication Sig Start Date End Date Taking? Authorizing Provider  acetaminophen (TYLENOL) 325 MG tablet Take 650 mg by mouth every 6 (six) hours as needed.    [provider]  benzonatate (TESSALON) 100 MG capsule Take 1 capsule (100 mg total) by mouth every 8 (eight) hours. 09/15/18   Wurst, [place], PA-C  cetirizine (ZYRTEC) 10 MG tablet Take 1 tablet (10 mg total) by mouth daily. 03/25/20   Loura Halt A, NP  doxycycline (VIBRAMYCIN) 100 MG capsule Take 1 capsule (100 mg total) by mouth 2 (two) times daily. 01/10/21   Volanda Napoleon, PA-C  HYDROcodone-acetaminophen (NORCO/VICODIN) 5-325 MG tablet Take 1-2 tablets by mouth every 6 (six) hours as  needed. 01/10/21   Volanda Napoleon, PA-C  hydrocortisone (ANUSOL-HC) 25 MG suppository Place 1 suppository (25 mg total) rectally 2 (two) times daily. 06/30/21   Chase Picket, MD  ibuprofen (ADVIL) 600 MG tablet Take 1 tablet (600 mg total) by mouth every 6 (six) hours as needed. 06/19/22   Talbot Grumbling, FNP  lactobacillus acidophilus & bulgar (LACTINEX) chewable tablet Chew 1 tablet by mouth 3 (three) times daily with meals. 10/24/21   Chase Picket, MD  meclizine (ANTIVERT) 25 MG tablet Take 1 tablet (25 mg total) by mouth 3 (three) times daily as needed for dizziness. 09/06/14   Harden Mo, MD  Olopatadine HCl 0.2 % SOLN Apply 1 drop to eye daily. 03/25/20   Bast, Tressia Miners A, NP  ondansetron (ZOFRAN-ODT) 4 MG disintegrating tablet Take 1 tablet (4 mg total) by mouth every 8 (eight) hours as needed for nausea or vomiting. 10/24/21   LampteyMyrene Galas, MD  ranitidine (ZANTAC) 150 MG capsule Take 1 capsule (150 mg total) by mouth 2 (two) times daily. 05/14/12 05/14/13  Melynda Ripple, MD  triamcinolone ointment (KENALOG) 0.5 % Apply 1 Application topically 2 (two) times daily. 06/19/22   Talbot Grumbling, FNP      Allergies    Cinnamon    Review of Systems   Review of Systems  Respiratory:  Positive for shortness of breath.   Neurological:  Positive for  dizziness.    Physical Exam Updated Vital Signs BP (!) 153/94   Pulse (!) 111   Temp 99.9 F (37.7 C) (Oral)   Resp 13   Ht '5\' 4"'$  (1.626 m)   Wt 131.1 kg   LMP 05/29/2022   SpO2 100%   BMI 49.61 kg/m  Physical Exam Vitals and nursing note reviewed.  Constitutional:      Appearance: Normal appearance.  HENT:     Head: Normocephalic and atraumatic.  Eyes:     General: No scleral icterus.    Conjunctiva/sclera: Conjunctivae normal.  Pulmonary:     Effort: Pulmonary effort is normal. No respiratory distress.  Genitourinary:    Comments: Rectal exam performed in presence of nurse.  Tan stool with small area  of bright red blood.  External hemorrhoids, nonthrombosed, do not appear to be bleeding Skin:    General: Skin is warm and dry.     Findings: No ecchymosis or rash.     Comments: 2.5 cm hematoma to the abdomen and similar areas behind left knee  Neurological:     Mental Status: She is alert.  Psychiatric:        Mood and Affect: Mood normal.     ED Results / Procedures / Treatments   Labs (all labs ordered are listed, but only abnormal results are displayed) Labs Reviewed  COMPREHENSIVE METABOLIC PANEL - Abnormal; Notable for the following components:      Result Value   CO2 21 (*)    Glucose, Bld 103 (*)    Calcium 8.5 (*)    Albumin 3.4 (*)    All other components within normal limits  CBC WITH DIFFERENTIAL/PLATELET - Abnormal; Notable for the following components:   RBC 3.64 (*)    Hemoglobin 6.1 (*)    HCT 23.0 (*)    MCV 63.2 (*)    MCH 16.8 (*)    MCHC 26.5 (*)    RDW 21.9 (*)    All other components within normal limits  RETICULOCYTES - Abnormal; Notable for the following components:   RBC. 3.74 (*)    Immature Retic Fract 21.4 (*)    All other components within normal limits  OCCULT BLOOD X 1 CARD TO LAB, STOOL - Abnormal; Notable for the following components:   Fecal Occult Bld POSITIVE (*)    All other components within normal limits  PROTIME-INR - Abnormal; Notable for the following components:   Prothrombin Time 15.4 (*)    All other components within normal limits  VITAMIN B12  FOLATE  IRON AND TIBC  FERRITIN  I-STAT BETA HCG BLOOD, ED ([facility], [facility], [facility] ONLY)  TYPE AND SCREEN  PREPARE RBC (CROSSMATCH)  ABO/RH    EKG None  Radiology No results found.  Procedures Procedures   Medications Ordered in ED Medications  0.9 %  sodium chloride infusion (has no administration in time range)  pantoprazole (PROTONIX) 80 mg /NS 100 mL IVPB (has no administration in time range)  pantoprozole (PROTONIX) 80 mg /NS 100 mL infusion (has no administration in time  range)  pantoprazole (PROTONIX) injection 40 mg (40 mg Intravenous Given 06/20/22 1509)    ED Course/ Medical Decision Making/ A&P                           Medical Decision Making Amount and/or Complexity of Data Reviewed Labs: ordered.  Risk Prescription drug management. Decision regarding hospitalization.   This patient presents to the  ED for concern of lightheadedness, shortness of breath and palpitations in the setting of anemia.  Differential includes but is not limited to PE, dissection, pneumothorax, pneumonia, upper GI bleed, lower GI bleed, hemorrhoids, fissure.   This is not an exhaustive differential.    Additional history: Patient low likelihood Wells criteria.  Is tachycardic however she believes is secondary to anxiety.  Also may be secondary to anemia.   Physical Exam: Pertinent physical exam findings include: Tachycardia  Lab Tests: I ordered, and personally interpreted labs.  The pertinent results include:  -Positive fecal occult -Hemoglobin 6.1 -Normal INR    Medications: I ordered medication including PRBCs.    Consultations Obtained: I discussed lab and imaging findings with [place] with  GI.  They recommend  clear liquid diet and admit  Disposition: 41 year old female presenting with anemia.  Fecal occult positive.  Agreed to blood transfusion, will admit for symptomatic anemia and GI bleed.  Of note, patient is borderline febrile with tachycardia and shortness of breath.  She is low likelihood Wells criteria for PE so no D-dimer or imaging was ordered.  Unable to apply PERC criteria due to heart rate   I discussed this case with my attending physician Dr. Eulis Foster who cosigned this note including patient's presenting symptoms, physical exam, and planned diagnostics and interventions. Attending physician stated agreement with plan or made changes to plan which were implemented.      Final Clinical Impression(s) / ED Diagnoses Final diagnoses:   Gastrointestinal hemorrhage, unspecified gastrointestinal hemorrhage type    Rx / DC Orders Admit to Dr. Nyra Jabs, [place], PA-C 06/20/22 1517    Daleen Bo, MD 06/20/22 [phone]

## 2022-06-20 NOTE — Progress Notes (Signed)
Pt arrived to room 5121 from ED via stretcher around 2030.  She  had Protonix infusion at 65m/hr infusing in her Left AC upon arrival.  Pt's IV site noted to have some small areas of ecchymosis under tegaderm and above IV insertion site on her inner arm.  Her arm did not appear swollen and was in line with pt's body habitus.  Pt's IV infiltrated in L AC with Protonix drip and was stopped at 2208.  I discussed with pharmacy and there is only supportive care required (warm compress, changing the IV site, etc).  She has developed 2 areas of swelling that the IV team RN thinks might need to be evaluated, one about 4cm round and the other about 2cm round.  She does have some ecchymosis and is tender to touch in the [place], but also has ecchymosis to her low abdomen (that has been there for a few days), her left hip that was noticed today and her left upper inner arm that has developed today after her IV was started.   Secure chat sent to NP ELissa Merlinwho is on call. SAyesha MohairBSN RN [place]7/22/2023, 11:17 PM   Protonix drip discontinued and PO Protonix given per order.  2nd unit of PRBC is done.  Pt has developed more of these swelling/red areas - behind her right knee and behind her right upper arm.  They do not look like urticaria and pt states she is not itching.  No Shob, VS changes, or other transfusion reaction symptoms.  Upon further review of her chart and discussion with pt and fiance, she has had these red areas that "come and go" to her legs. NP ELissa Merlinmade aware and will continue to monitor for further issues should any arise. SAyesha MohairBSN RN C[place]uff [place]7/23/2023, 12:27 AM

## 2022-06-20 NOTE — Progress Notes (Unsigned)
    SUBJECTIVE:   CHIEF COMPLAINT / HPI:   Right hand pain and swelling Patient seen in ED on 06/19/2022 for right hand pain and swelling.  Was given ketorolac injection and Tylenol  PERTINENT  PMH / PSH: ***  OBJECTIVE:   LMP 05/29/2022  ***  General: NAD, pleasant, able to participate in exam Cardiac: RRR, no murmurs. Respiratory: CTAB, normal effort, No wheezes, rales or rhonchi Abdomen: Bowel sounds present, nontender, nondistended, no hepatosplenomegaly. Extremities: no edema or cyanosis. Skin: warm and dry, no rashes noted Neuro: alert, no obvious focal deficits Psych: Normal affect and mood  ASSESSMENT/PLAN:   No problem-specific Assessment & Plan notes found for this encounter.     Dr. Precious Gilding, [place]    {    This will disappear when note is signed, click to select method of visit    :1}

## 2022-06-20 NOTE — Telephone Encounter (Signed)
Contacted patient via phone call this morning using 2 patient identifiers over the phone.  Lab work drawn in urgent care yesterday concerning for acute anemia and blood loss from unknown location.  Hemoglobin is 6.7.  Platelets are elevated at 402.  There is concern for possible inflammatory process due to elevated CRP and ESR.  Given patient's symptoms of swelling in the right hand without trauma or injury as well as rash to the abdomen and 2-week history of bruising to the legs that "fades away" as well as lab work findings patient advised to go to the emergency room today for further evaluation and work-up.  Patient is agreeable with this plan.  Discussed risks of deferring emergency department visit at this time.  Patient reports that she is feeling better this morning after a good night of sleep.  She continues to report intermittent and episodic dizziness, fatigue, and shortness of breath.  Patient verbalizes understanding and agreement with plan for ER visit and is aware of locations of the closest emergency department with [facility] health.

## 2022-06-20 NOTE — H&P (Signed)
History and Physical    Patient: Dana Campbell:[id number] DOB: November 24, 1981 DOA: 06/20/2022 DOS: the patient was seen and examined on 06/20/2022 PCP: Precious Gilding, DO  Patient coming from: Home  Chief Complaint:  Chief Complaint  Patient presents with   Abnormal Lab    Low hgb   Shortness of Breath   Dizziness   Leg Swelling   HPI: Dana Campbell is a 41 y.o. female with medical history significant of menorrhagia, fibroids. Presenting with abnormal labs. She reports that she had right hand swelling for a couple of days and BLE swelling for a couple of weeks. She has no previous history of HF. She decided to go to urgent care yesterday to get checked out. She was given toradol for her hand and sent home. Labs were drawn as part of her work up. When they resulted today, she was notified that her Hgb level was low and it was recommended that she go to the ED for evaluation. The patient notes that she has had some BRBPR intermittently over the last couple of weeks. She did not have any dark stools. She is not currently on her cycle, but says she should be next week. She notes that she has heavy cycles typically. She denies any iron supplementation. She denies dyspnea, but she does note fatigue and lightheadedness lately. She also notes NSAID use for the pain caused by the swelling in her legs. She otherwise denies any other aggravating or alleviating factors.    Review of Systems: As mentioned in the history of present illness. All other systems reviewed and are negative.  PMHx Menorrhagia  Fibroids  PSHx History reviewed. No pertinent surgical history.  Social History:  reports that she has never smoked. She has never used smokeless tobacco. She reports current alcohol use. She reports that she does not use drugs.  Allergies  Allergen Reactions   Cinnamon Itching   Fam Hx History reviewed. No pertinent family history.  Prior to Admission medications   Medication Sig Start  Date End Date Taking? Authorizing Provider  acetaminophen (TYLENOL) 500 MG tablet Take 1,000 mg by mouth every 8 (eight) hours as needed (pain).   Yes [provider]  Calcium Carbonate Antacid (TUMS PO) Take 2 tablets by mouth as needed (heartburn).   Yes [provider]  cetirizine (ZYRTEC) 10 MG tablet Take 1 tablet (10 mg total) by mouth daily. 03/25/20  Yes Bast, Traci A, NP  ibuprofen (ADVIL) 200 MG tablet Take 600 mg by mouth as needed (pain).   Yes [provider]  benzonatate (TESSALON) 100 MG capsule Take 1 capsule (100 mg total) by mouth every 8 (eight) hours. Patient not taking: Reported on 06/20/2022 09/15/18   Wurst, [place], PA-C  doxycycline (VIBRAMYCIN) 100 MG capsule Take 1 capsule (100 mg total) by mouth 2 (two) times daily. Patient not taking: Reported on 06/20/2022 01/10/21   Volanda Napoleon, PA-C  hydrocortisone (ANUSOL-HC) 25 MG suppository Place 1 suppository (25 mg total) rectally 2 (two) times daily. Patient not taking: Reported on 06/20/2022 06/30/21   Chase Picket, MD  ibuprofen (ADVIL) 600 MG tablet Take 1 tablet (600 mg total) by mouth every 6 (six) hours as needed. Patient not taking: Reported on 06/20/2022 06/19/22   Talbot Grumbling, FNP  lactobacillus acidophilus & bulgar (LACTINEX) chewable tablet Chew 1 tablet by mouth 3 (three) times daily with meals. Patient not taking: Reported on 06/20/2022 10/24/21   Chase Picket, MD  Olopatadine HCl 0.2 %  SOLN Apply 1 drop to eye daily. Patient not taking: Reported on 06/20/2022 03/25/20   Loura Halt A, NP  ondansetron (ZOFRAN-ODT) 4 MG disintegrating tablet Take 1 tablet (4 mg total) by mouth every 8 (eight) hours as needed for nausea or vomiting. Patient not taking: Reported on 06/20/2022 10/24/21   Chase Picket, MD  ranitidine (ZANTAC) 150 MG capsule Take 1 capsule (150 mg total) by mouth 2 (two) times daily. Patient not taking: Reported on 06/20/2022 05/14/12 05/14/13  Melynda Ripple, MD  triamcinolone ointment (KENALOG) 0.5 % Apply 1 Application topically 2 (two) times daily. Patient not taking: Reported on 06/20/2022 06/19/22   Talbot Grumbling, FNP    Physical Exam: Vitals:   06/20/22 1415 06/20/22 1430 06/20/22 1445 06/20/22 1500  BP: 135/84 133/81 135/82 139/85  Pulse: (!) 109 (!) 108 (!) 105 (!) 106  Resp: 14 (!) 21 20 20   Temp:      TempSrc:      SpO2: 100% 100% 100% 100%  Weight:      Height:       General: 41 y.o. female resting in bed in NAD Eyes: PERRL, normal sclera ENMT: Nares patent w/o discharge, orophaynx clear, dentition normal, ears w/o discharge/lesions/ulcers Neck: Supple, trachea midline Cardiovascular: RRR, +S1, S2, no m/g/r, equal pulses throughout Respiratory: CTABL, no w/r/r, normal WOB GI: BS+, NDNT, no masses noted, no organomegaly noted MSK: No c/c; BLE edema 2+, R hand edema Skin: No rashes, bruises, ulcerations noted Neuro: A&O x 3, no focal deficits Psyc: Appropriate interaction and affect, calm/cooperative  Data Reviewed:  Lab Results  Component Value Date   NA 136 06/20/2022   K 3.5 06/20/2022   CO2 21 (L) 06/20/2022   GLUCOSE 103 (H) 06/20/2022   BUN 10 06/20/2022   CREATININE 0.76 06/20/2022   CALCIUM 8.5 (L) 06/20/2022   GFRNONAA >60 06/20/2022   Lab Results  Component Value Date   WBC 8.8 06/20/2022   HGB 6.1 (LL) 06/20/2022   HCT 23.0 (L) 06/20/2022   MCV 63.2 (L) 06/20/2022   PLT 365 06/20/2022   EKG: sinus tach, no st elevations  Assessment and Plan: GIB Symptomatic anemia Iron deficiency      - admit to inpt, tele     - q6h H&H; transfuse for Hgb <7     - getting 2 unit pRBCs     - she is iron deficient     - [facility] consulted, appreciate assistance     - PPI     - CLD, NPOpMN  BLE edema     - no history of HF     - check BNP  R hand edema     - specifically the hand; not the arm     - she had a elevated CRP yesterday, add ESR     - no history of arthritis/autoimmune  diseases     - Can check ANA reflex     - no specific injury to the hand, but can check hand film     - no current concern for compartment syndrome  Morbid obesity     - counsel on diet/lifestyle changes  Advance Care Planning:   Code Status: FULL  Consults: [facility]  Family Communication: w/ family at bedside  Severity of Illness: The appropriate patient status for this patient is INPATIENT. Inpatient status is judged to be reasonable and necessary in order to provide the required intensity of service to ensure the patient's safety. The patient's  presenting symptoms, physical exam findings, and initial radiographic and laboratory data in the context of their chronic comorbidities is felt to place them at high risk for further clinical deterioration. Furthermore, it is not anticipated that the patient will be medically stable for discharge from the hospital within 2 midnights of admission.   * I certify that at the point of admission it is my clinical judgment that the patient will require inpatient hospital care spanning beyond 2 midnights from the point of admission due to high intensity of service, high risk for further deterioration and high frequency of surveillance required.*  Author: Jonnie Finner, DO 06/20/2022 3:17 PM  For on call review [email or web address].

## 2022-06-20 NOTE — ED Triage Notes (Signed)
PT was seen at [facility] yesterday and was told this morning to come to the ER due to her hgb being 6.7. pt reports sob, dizziness, and swelling in bilateral legs and right hand.

## 2022-06-21 DIAGNOSIS — K922 Gastrointestinal hemorrhage, unspecified: Secondary | ICD-10-CM | POA: Diagnosis not present

## 2022-06-21 LAB — COMPREHENSIVE METABOLIC PANEL
ALT: 9 U/L (ref 0–44)
AST: 16 U/L (ref 15–41)
Albumin: 3.3 g/dL — ABNORMAL LOW (ref 3.5–5.0)
Alkaline Phosphatase: 52 U/L (ref 38–126)
Anion gap: 7 (ref 5–15)
BUN: 7 mg/dL (ref 6–20)
CO2: 20 mmol/L — ABNORMAL LOW (ref 22–32)
Calcium: 8.7 mg/dL — ABNORMAL LOW (ref 8.9–10.3)
Chloride: 112 mmol/L — ABNORMAL HIGH (ref 98–111)
Creatinine, Ser: 0.61 mg/dL (ref 0.44–1.00)
GFR, Estimated: >60 mL/min (ref >60)
Glucose, Bld: 92 mg/dL (ref 70–99)
Potassium: 3.5 mmol/L (ref 3.5–5.1)
Sodium: 139 mmol/L (ref 135–145)
Total Bilirubin: 1 mg/dL (ref 0.3–1.2)
Total Protein: 8.1 g/dL (ref 6.5–8.1)

## 2022-06-21 LAB — CBC
HCT: 29.8 % — ABNORMAL LOW (ref 36.0–46.0)
Hemoglobin: 8.6 g/dL — ABNORMAL LOW (ref 12.0–15.0)
MCH: 19.9 pg — ABNORMAL LOW (ref 26.0–34.0)
MCHC: 28.9 g/dL — ABNORMAL LOW (ref 30.0–36.0)
MCV: 68.8 fL — ABNORMAL LOW (ref 80.0–100.0)
Platelets: 356 10*3/uL (ref 150–400)
RBC: 4.33 MIL/uL (ref 3.87–5.11)
RDW: 25.7 % — ABNORMAL HIGH (ref 11.5–15.5)
WBC: 9.2 10*3/uL (ref 4.0–10.5)
nRBC: 0.2 % (ref 0.0–0.2)

## 2022-06-21 LAB — TYPE AND SCREEN
ABO/RH(D): O POS
Antibody Screen: NEGATIVE
Unit division: 0
Unit division: 0

## 2022-06-21 LAB — BPAM RBC
Blood Product Expiration Date: 202308212359
Blood Product Expiration Date: 202308222359
ISSUE DATE / TIME: 202307221544
ISSUE DATE / TIME: 202307222038
Unit Type and Rh: 5100
Unit Type and Rh: 5100

## 2022-06-21 LAB — HEMOGLOBIN AND HEMATOCRIT, BLOOD
HCT: 30.5 % — ABNORMAL LOW (ref 36.0–46.0)
Hemoglobin: 8.7 g/dL — ABNORMAL LOW (ref 12.0–15.0)

## 2022-06-21 LAB — HIV ANTIBODY (ROUTINE TESTING W REFLEX): HIV Screen 4th Generation wRfx: NONREACTIVE

## 2022-06-21 MED ORDER — ORAL CARE MOUTH RINSE: 15.0000 mL | OROMUCOSAL | Status: DC | PRN

## 2022-06-21 MED ORDER — FERROUS GLUCONATE 324 (38 FE) MG PO TABS
324.0000 mg | ORAL_TABLET | Freq: Three times a day (TID) | ORAL | Status: DC
  Administered 2022-06-21 – 2022-06-24 (×6): 324 mg via ORAL
  Filled 2022-06-21 (×10): qty 1

## 2022-06-21 MED ORDER — FAMOTIDINE IN NACL 20-0.9 MG/50ML-% IV SOLN
20.0000 mg | Freq: Two times a day (BID) | INTRAVENOUS | Status: AC
  Administered 2022-06-21 (×2): 20 mg via INTRAVENOUS
  Filled 2022-06-21 (×2): qty 50

## 2022-06-21 MED ORDER — PEG 3350-KCL-NA BICARB-NACL 420 G PO SOLR
4000.0000 mL | Freq: Once | ORAL | Status: AC
  Administered 2022-06-21: 4000 mL via ORAL

## 2022-06-21 MED ORDER — DIPHENHYDRAMINE HCL 25 MG PO CAPS
25.0000 mg | ORAL_CAPSULE | Freq: Four times a day (QID) | ORAL | Status: DC | PRN
  Administered 2022-06-21 (×2): 25 mg via ORAL
  Filled 2022-06-21 (×2): qty 1

## 2022-06-21 MED ORDER — CYANOCOBALAMIN 1000 MCG/ML IJ SOLN
1000.0000 ug | Freq: Every day | INTRAMUSCULAR | Status: DC
Start: 2022-06-21 — End: 2022-06-24
  Administered 2022-06-21 – 2022-06-24 (×4): 1000 ug via INTRAMUSCULAR
  Filled 2022-06-21 (×4): qty 1

## 2022-06-21 NOTE — Progress Notes (Signed)
PROGRESS NOTE  Dana Campbell NIO:[id number] DOB: 1981/11/14 DOA: 06/20/2022 PCP: Precious Gilding, DO   LOS: 1 day   Brief Narrative / Interim history: 41 y.o. female with medical history significant of menorrhagia, fibroids. Presenting with abnormal labs. She reports that she had right hand swelling for a couple of days and BLE swelling for a couple of weeks. She has no previous history of HF. She decided to go to urgent care yesterday to get checked out. She was given toradol for her hand and sent home. Labs were drawn as part of her work up. When they resulted, she was notified that her Hgb level was low and it was recommended that she go to the ED for evaluation. The patient notes that she has had some BRBPR intermittently over the last couple of weeks. She did not have any dark stools  Subjective / 24h Interval events: Complains of a rash on bilateral arms, abdomen, legs.  The rash on the arms appeared after the blood transfusion yesterday but the 1 on abdomen and legs are coming and going prior to coming to the hospital  Assesement and Plan: Principal Problem:   GIB (gastrointestinal bleeding) Active Problems:   Symptomatic anemia   Iron deficiency anemia   Swelling of lower extremity   Swelling of right hand   Obesity, Class III, BMI 40-49.9 (morbid obesity) (HCC)  Principal problem GI bleed, symptomatic anemia-GI consulted, discussed with Dr. Alessandra Bevels, he will evaluate patient later on.  She was transfused 2 units of packed red blood cells with adequate improvement in her hemoglobin.  Continue to monitor  Active problems Bilateral lower extremity swelling, intermittent pain-states that her swelling may be a little bit better today.  BNP is normal going against heart failure but BNP can be normal in patients with obesity.  She has been having intermittent joint pain, which may suggest an autoimmune process  Right hand swelling-unclear etiology.  There is no trauma, and is not  tender but has some stiffness in forming a fist.  This may potentially represent an autoimmune illness.  ANA with reflex was sent and is pending.  Uric acid is negative, and given lack of significant tenderness with palpation gout is unlikely.  She has elevated inflammatory markers with sed rate and CRP.  Would like to avoid steroids in the setting of GI bleed  Acute blood loss anemia, iron deficiency anemia, B12 deficiency, microcytic anemia-anemia panel shows significant iron deficiency.  Start iron supplements.  B12 was quite low as well at 182.  Start B12 supplements  Skin rash-patient developed a rash on her forearms, back, after the blood transfusion which may represent a transfusion reaction.  Odd thing is that she has been having intermittent areas of similar rashes coming and going on her legs, abdomen, back.  Current most active rash appears raised, cannot fully rule out transfusion reaction and will give Benadryl, famotidine.  Avoid steroids given GI bleed  Fibroids, heavy menses-likely etiology for her iron deficiency anemia.  Outpatient follow-up with OB/GYN  Obesity, morbid-BMI 49.6.  She would benefit from weight loss   Scheduled Meds:  cyanocobalamin  1,000 mcg Intramuscular Daily   ferrous gluconate  324 mg Oral TID WC   loratadine  10 mg Oral Daily   pantoprazole  40 mg Oral BID   Continuous Infusions:  famotidine (PEPCID) IV 20 mg (06/21/22 0943)   PRN Meds:.acetaminophen **OR** acetaminophen, diphenhydrAMINE, oxyCODONE  Diet Orders (From admission, onward)     Start  Ordered   06/21/22 0001  Diet NPO time specified  Diet effective midnight        06/20/22 2047            DVT prophylaxis: SCDs Start: 06/20/22 2048   Lab Results  Component Value Date   PLT 356 06/21/2022      Code Status: Full Code  Status is: Inpatient Remains inpatient appropriate because: Persistent symptoms, new rash   Level of care: Telemetry  Consultants:   GI   Objective: Vitals:   06/20/22 2100 06/21/22 0007 06/21/22 0445 06/21/22 0842  BP: (!) 146/81 (!) 144/88 137/78 129/75  Pulse: (!) 105 (!) 110 (!) 104 (!) 106  Resp: '20 20 18 17  '$ Temp: 98.5 F (36.9 C) 98.6 F (37 C) 99 F (37.2 C) 99.1 F (37.3 C)  TempSrc: Oral Oral    SpO2: 100% 100% 100% 100%  Weight:      Height:        Intake/Output Summary (Last 24 hours) at 06/21/2022 1047 Last data filed at 06/21/2022 0600 Gross per 24 hour  Intake 1488.53 ml  Output --  Net 1488.53 ml   Wt Readings from Last 3 Encounters:  06/20/22 131.1 kg  01/10/21 (!) 141.1 kg  12/01/19 (!) 137.4 kg    Examination:  Constitutional: NAD Eyes: no scleral icterus ENMT: Mucous membranes are moist.  Neck: normal, supple Respiratory: clear to auscultation bilaterally, no wheezing, no crackles.  Cardiovascular: Regular rate and rhythm, no murmurs / rubs / gallops.  Trace edema Abdomen: non distended, no tenderness. Bowel sounds positive.  Musculoskeletal: no clubbing / cyanosis.  Skin: Diffuse patchy rash on arms, abdomen, legs in various stages with most active lesions on her arms  Data Reviewed: I have independently reviewed following labs and imaging studies  CBC Recent Labs  Lab 06/19/22 2005 06/20/22 1234 06/21/22 0242  WBC 11.3* 8.8 9.2  HGB 6.7* 6.1* 8.6*  HCT 25.1* 23.0* 29.8*  PLT 402* 365 356  MCV 62.3* 63.2* 68.8*  MCH 16.6* 16.8* 19.9*  MCHC 26.7* 26.5* 28.9*  RDW 21.9* 21.9* 25.7*  LYMPHSABS  --  0.9  --   MONOABS  --  0.7  --   EOSABS  --  0.4  --   BASOSABS  --  0.0  --     Recent Labs  Lab 06/19/22 2005 06/20/22 1234 06/20/22 1402 06/20/22 2034 06/21/22 0242  NA 138 136  --   --  139  K 3.4* 3.5  --   --  3.5  CL 108 109  --   --  112*  CO2 21* 21*  --   --  20*  GLUCOSE 88 103*  --   --  92  BUN 7 10  --   --  7  CREATININE 0.63 0.76  --   --  0.61  CALCIUM 9.1 8.5*  --   --  8.7*  AST  --  17  --   --  16  ALT  --  7  --   --  9  ALKPHOS   --  52  --   --  52  BILITOT  --  0.4  --   --  1.0  ALBUMIN  --  3.4*  --   --  3.3*  CRP 2.5*  --   --   --   --   INR  --   --  1.2  --   --   BNP  --   --   --  25.6  --     ------------------------------------------------------------------------------------------------------------------ No results for input(s): "CHOL", "HDL", "LDLCALC", "TRIG", "CHOLHDL", "LDLDIRECT" in the last 72 hours.  No results found for: "HGBA1C" ------------------------------------------------------------------------------------------------------------------ No results for input(s): "TSH", "T4TOTAL", "T3FREE", "THYROIDAB" in the last 72 hours.  Invalid input(s): "FREET3"  Cardiac Enzymes No results for input(s): "CKMB", "TROPONINI", "MYOGLOBIN" in the last 168 hours.  Invalid input(s): "CK" ------------------------------------------------------------------------------------------------------------------    Component Value Date/Time   BNP 25.6 06/20/2022 2034    CBG: No results for input(s): "GLUCAP" in the last 168 hours.  No results found for this or any previous visit (from the past 240 hour(s)).   Radiology Studies: DG Hand Complete Right  Result Date: 06/20/2022 CLINICAL DATA:  Pt reported right posterior hand swelling for a few days with pain. Pt denied any injury. EXAM: RIGHT HAND - COMPLETE 3+ VIEW COMPARISON:  None Available. FINDINGS: No fracture or bone lesion. Joints are normally spaced and aligned.  No arthropathic changes. Dorsal soft tissue swelling. No soft tissue air. No radiopaque foreign body. IMPRESSION: 1. No fracture, bone lesion or joint abnormality. 2. Dorsal soft tissue swelling. Electronically Signed   By: Lajean Manes M.D.   On: 06/20/2022 18:38     Marzetta Board, MD, PhD [facility]  Between 7 am - 7 pm I am available, please contact me via Amion (for emergencies) or Securechat (non urgent messages)  Between 7 pm - 7 am I am not available, please contact  night coverage MD/APP via Amion

## 2022-06-21 NOTE — H&P (View-Only) (Signed)
Referring Provider:  ED Primary Care Physician:  Jones, Sarah, DO Primary Gastroenterologist: Unassigned  Reason for Consultation: Anemia  HPI: Dana Campbell is a 41 y.o. female with past medical history of uterine fibroids and menorrhagia presented to the hospital with shortness of breath and abnormal labs showing anemia.  Patient was seen in urgent care for evaluation of fatigue and hand swelling for few days.  Blood work showed anemia with hemoglobin in range of 6 and she was advised to come to the ED.  Initial blood work here revealed anemia with hemoglobin of 6.1, low MCV, normal CMP, normal vitamin B12 and folate level.  Iron studies showed low iron saturation of 4 and low ferritin of 10 consistent with iron deficiency anemia.  Occult blood positive.  Elevated ESR and CRP.  Patient seen and examined at bedside.  Family and fianc at bedside.  According to her, she has been having intermittent rectal bleeding for last 1 year.  She is also complaining of stomach upset with history of reflux.  According to her, she was diagnosed with ulcer disease in the past but she never had EGD or colonoscopy before.  She takes ibuprofen as needed for pain.  Looks like she has developed worsening rash since hospital admission.  Could be related to Protonix infusion.  Currently on p.o. Protonix.  History reviewed. No pertinent past medical history.  History reviewed. No pertinent surgical history.  Prior to Admission medications   Medication Sig Start Date End Date Taking? Authorizing Provider  acetaminophen (TYLENOL) 500 MG tablet Take 1,000 mg by mouth every 8 (eight) hours as needed (pain).   Yes [provider]  Calcium Carbonate Antacid (TUMS PO) Take 2 tablets by mouth as needed (heartburn).   Yes [provider]  cetirizine (ZYRTEC) 10 MG tablet Take 1 tablet (10 mg total) by mouth daily. 03/25/20  Yes Bast, Traci A, NP  ibuprofen (ADVIL) 200 MG tablet Take 600 mg by mouth as  needed (pain).   Yes [provider]  benzonatate (TESSALON) 100 MG capsule Take 1 capsule (100 mg total) by mouth every 8 (eight) hours. Patient not taking: Reported on 06/20/2022 09/15/18   Wurst, Brittany, PA-C  doxycycline (VIBRAMYCIN) 100 MG capsule Take 1 capsule (100 mg total) by mouth 2 (two) times daily. Patient not taking: Reported on 06/20/2022 01/10/21   Layden, Lindsey A, PA-C  hydrocortisone (ANUSOL-HC) 25 MG suppository Place 1 suppository (25 mg total) rectally 2 (two) times daily. Patient not taking: Reported on 06/20/2022 06/30/21   Lamptey, Philip O, MD  ibuprofen (ADVIL) 600 MG tablet Take 1 tablet (600 mg total) by mouth every 6 (six) hours as needed. Patient not taking: Reported on 06/20/2022 06/19/22   Stanhope, Catharine M, FNP  lactobacillus acidophilus & bulgar (LACTINEX) chewable tablet Chew 1 tablet by mouth 3 (three) times daily with meals. Patient not taking: Reported on 06/20/2022 10/24/21   Lamptey, Philip O, MD  Olopatadine HCl 0.2 % SOLN Apply 1 drop to eye daily. Patient not taking: Reported on 06/20/2022 03/25/20   Bast, Traci A, NP  ondansetron (ZOFRAN-ODT) 4 MG disintegrating tablet Take 1 tablet (4 mg total) by mouth every 8 (eight) hours as needed for nausea or vomiting. Patient not taking: Reported on 06/20/2022 10/24/21   Lamptey, Philip O, MD  ranitidine (ZANTAC) 150 MG capsule Take 1 capsule (150 mg total) by mouth 2 (two) times daily. Patient not taking: Reported on 06/20/2022 05/14/12 05/14/13  Mortenson, Ashley, MD  triamcinolone ointment (KENALOG)   0.5 % Apply 1 Application topically 2 (two) times daily. Patient not taking: Reported on 06/20/2022 06/19/22   Talbot Grumbling, FNP    Scheduled Meds:  cyanocobalamin  1,000 mcg Intramuscular Daily   ferrous gluconate  324 mg Oral TID WC   loratadine  10 mg Oral Daily   pantoprazole  40 mg Oral BID   Continuous Infusions:  famotidine (PEPCID) IV 20 mg (06/21/22 0943)   PRN Meds:.acetaminophen  **OR** acetaminophen, diphenhydrAMINE, oxyCODONE  Allergies as of 06/20/2022 - Review Complete 06/20/2022  Allergen Reaction Noted   Cinnamon Itching 09/06/2014    History reviewed. No pertinent family history.  Social History   Socioeconomic History   Marital status: Unknown    Spouse name: Not on file   Number of children: Not on file   Years of education: Not on file   Highest education level: Not on file  Occupational History   Not on file  Tobacco Use   Smoking status: Never   Smokeless tobacco: Never  Vaping Use   Vaping Use: Never used  Substance and Sexual Activity   Alcohol use: Yes    Comment: occ    Drug use: No   Sexual activity: Not on file  Other Topics Concern   Not on file  Social History Narrative   Not on file   Social Determinants of Health   Financial Resource Strain: Not on file  Food Insecurity: Not on file  Transportation Needs: Not on file  Physical Activity: Not on file  Stress: Not on file  Social Connections: Not on file  Intimate Partner Violence: Not on file    Review of Systems: All negative except as stated above in HPI.  Physical Exam: Vital signs: Vitals:   06/21/22 0445 06/21/22 0842  BP: 137/78 129/75  Pulse: (!) 104 (!) 106  Resp: 18 17  Temp: 99 F (37.2 C) 99.1 F (37.3 C)  SpO2: 100% 100%   Last BM Date : 06/20/22 General:   Morbidly obese, not in acute distress HEENT -normocephalic, atraumatic, extraocular movement intact Lungs: No visible respiratory distress, anterior exam only Heart:  Regular rate and rhythm; no murmurs, clicks, rubs,  or gallops. Abdomen: Soft, nontender, nondistended, bowel sounds present, no peritoneal signs Neuro -alert and oriented x3 Psych -mood and affect normal Rectal:  Deferred  GI:  Lab Results: Recent Labs    06/19/22 2005 06/20/22 1234 06/21/22 0242  WBC 11.3* 8.8 9.2  HGB 6.7* 6.1* 8.6*  HCT 25.1* 23.0* 29.8*  PLT 402* 365 356   BMET Recent Labs    06/19/22 2005  06/20/22 1234 06/21/22 0242  NA 138 136 139  K 3.4* 3.5 3.5  CL 108 109 112*  CO2 21* 21* 20*  GLUCOSE 88 103* 92  BUN _0 CREATININE 0.63 0.76 0.61  CALCIUM 9.1 8.5* 8.7*   LFT Recent Labs    06/21/22 0242  PROT 8.1  ALBUMIN 3.3*  AST 16  ALT 9  ALKPHOS 52  BILITOT 1.0   PT/INR Recent Labs    06/20/22 1402  LABPROT 15.4*  INR 1.2     Studies/Results: DG Hand Complete Right  Result Date: 06/20/2022 CLINICAL DATA:  Pt reported right posterior hand swelling for a few days with pain. Pt denied any injury. EXAM: RIGHT HAND - COMPLETE 3+ VIEW COMPARISON:  None Available. FINDINGS: No fracture or bone lesion. Joints are normally spaced and aligned.  No arthropathic changes. Dorsal soft tissue swelling. No soft tissue  air. No radiopaque foreign body. IMPRESSION: 1. No fracture, bone lesion or joint abnormality. 2. Dorsal soft tissue swelling. Electronically Signed   By: David  Ormond M.D.   On: 06/20/2022 18:38    Impression/Plan: -Severe anemia with hemoglobin down to 6.1.  Intermittent rectal bleeding for 1 year.  History of reflux and NSAID use. -History of uterine fibroid and menorrhagia. -Rash - Looks like she has developed worsening rash since hospital admission.  Could be related to Protonix infusion.  Currently on p.o. Protonix.  Recommendation ------------------------ -Plan for EGD and colonoscopy tomorrow. -ok to  have clear liquids diet today.  Keep n.p.o. past midnight. -Avoid NSAIDs -Okay to DC Protonix for questionable PPI related rash  Risks (bleeding, infection, bowel perforation that could require surgery, sedation-related changes in cardiopulmonary systems), benefits (identification and possible treatment of source of symptoms, exclusion of certain causes of symptoms), and alternatives (watchful waiting, radiographic imaging studies, empiric medical treatment)  were explained to patient/family in detail and patient wishes to proceed.     LOS: 1 day    Jaiya Mooradian  MD, FACP 06/21/2022, 10:29 AM  Contact #  [phone]  

## 2022-06-21 NOTE — Consult Note (Signed)
Referring Provider:  ED Primary Care Physician:  Precious Gilding, DO Primary Gastroenterologist: Althia Forts  Reason for Consultation: Anemia  HPI: Dana Campbell is a 41 y.o. female with past medical history of uterine fibroids and menorrhagia presented to the hospital with shortness of breath and abnormal labs showing anemia.  Patient was seen in urgent care for evaluation of fatigue and hand swelling for few days.  Blood work showed anemia with hemoglobin in range of 6 and she was advised to come to the ED.  Initial blood work here revealed anemia with hemoglobin of 6.1, low MCV, normal CMP, normal vitamin B12 and folate level.  Iron studies showed low iron saturation of 4 and low ferritin of 10 consistent with iron deficiency anemia.  Occult blood positive.  Elevated ESR and CRP.  Patient seen and examined at bedside.  Family and fianc at bedside.  According to her, she has been having intermittent rectal bleeding for last 1 year.  She is also complaining of stomach upset with history of reflux.  According to her, she was diagnosed with ulcer disease in the past but she never had EGD or colonoscopy before.  She takes ibuprofen as needed for pain.  Looks like she has developed worsening rash since hospital admission.  Could be related to Protonix infusion.  Currently on p.o. Protonix.  History reviewed. No pertinent past medical history.  History reviewed. No pertinent surgical history.  Prior to Admission medications   Medication Sig Start Date End Date Taking? Authorizing Provider  acetaminophen (TYLENOL) 500 MG tablet Take 1,000 mg by mouth every 8 (eight) hours as needed (pain).   Yes [provider]  Calcium Carbonate Antacid (TUMS PO) Take 2 tablets by mouth as needed (heartburn).   Yes [provider]  cetirizine (ZYRTEC) 10 MG tablet Take 1 tablet (10 mg total) by mouth daily. 03/25/20  Yes Bast, Traci A, NP  ibuprofen (ADVIL) 200 MG tablet Take 600 mg by mouth as  needed (pain).   Yes [provider]  benzonatate (TESSALON) 100 MG capsule Take 1 capsule (100 mg total) by mouth every 8 (eight) hours. Patient not taking: Reported on 06/20/2022 09/15/18   Wurst, [place], PA-C  doxycycline (VIBRAMYCIN) 100 MG capsule Take 1 capsule (100 mg total) by mouth 2 (two) times daily. Patient not taking: Reported on 06/20/2022 01/10/21   Volanda Napoleon, PA-C  hydrocortisone (ANUSOL-HC) 25 MG suppository Place 1 suppository (25 mg total) rectally 2 (two) times daily. Patient not taking: Reported on 06/20/2022 06/30/21   Chase Picket, MD  ibuprofen (ADVIL) 600 MG tablet Take 1 tablet (600 mg total) by mouth every 6 (six) hours as needed. Patient not taking: Reported on 06/20/2022 06/19/22   Talbot Grumbling, FNP  lactobacillus acidophilus & bulgar (LACTINEX) chewable tablet Chew 1 tablet by mouth 3 (three) times daily with meals. Patient not taking: Reported on 06/20/2022 10/24/21   Chase Picket, MD  Olopatadine HCl 0.2 % SOLN Apply 1 drop to eye daily. Patient not taking: Reported on 06/20/2022 03/25/20   Loura Halt A, NP  ondansetron (ZOFRAN-ODT) 4 MG disintegrating tablet Take 1 tablet (4 mg total) by mouth every 8 (eight) hours as needed for nausea or vomiting. Patient not taking: Reported on 06/20/2022 10/24/21   Chase Picket, MD  ranitidine (ZANTAC) 150 MG capsule Take 1 capsule (150 mg total) by mouth 2 (two) times daily. Patient not taking: Reported on 06/20/2022 05/14/12 05/14/13  Melynda Ripple, MD  triamcinolone ointment (KENALOG)  0.5 % Apply 1 Application topically 2 (two) times daily. Patient not taking: Reported on 06/20/2022 06/19/22   Talbot Grumbling, FNP    Scheduled Meds:  cyanocobalamin  1,000 mcg Intramuscular Daily   ferrous gluconate  324 mg Oral TID WC   loratadine  10 mg Oral Daily   pantoprazole  40 mg Oral BID   Continuous Infusions:  famotidine (PEPCID) IV 20 mg (06/21/22 0943)   PRN Meds:.acetaminophen  **OR** acetaminophen, diphenhydrAMINE, oxyCODONE  Allergies as of 06/20/2022 - Review Complete 06/20/2022  Allergen Reaction Noted   Cinnamon Itching 09/06/2014    History reviewed. No pertinent family history.  Social History   Socioeconomic History   Marital status: Unknown    Spouse name: Not on file   Number of children: Not on file   Years of education: Not on file   Highest education level: Not on file  Occupational History   Not on file  Tobacco Use   Smoking status: Never   Smokeless tobacco: Never  Vaping Use   Vaping Use: Never used  Substance and Sexual Activity   Alcohol use: Yes    Comment: occ    Drug use: No   Sexual activity: Not on file  Other Topics Concern   Not on file  Social History Narrative   Not on file   Social Determinants of Health   Financial Resource Strain: Not on file  Food Insecurity: Not on file  Transportation Needs: Not on file  Physical Activity: Not on file  Stress: Not on file  Social Connections: Not on file  Intimate Partner Violence: Not on file    Review of Systems: All negative except as stated above in HPI.  Physical Exam: Vital signs: Vitals:   06/21/22 0445 06/21/22 0842  BP: 137/78 129/75  Pulse: (!) 104 (!) 106  Resp: 18 17  Temp: 99 F (37.2 C) 99.1 F (37.3 C)  SpO2: 100% 100%   Last BM Date : 06/20/22 General:   Morbidly obese, not in acute distress HEENT -normocephalic, atraumatic, extraocular movement intact Lungs: No visible respiratory distress, anterior exam only Heart:  Regular rate and rhythm; no murmurs, clicks, rubs,  or gallops. Abdomen: Soft, nontender, nondistended, bowel sounds present, no peritoneal signs Neuro -alert and oriented x3 Psych -mood and affect normal Rectal:  Deferred  GI:  Lab Results: Recent Labs    06/19/22 2005 06/20/22 1234 06/21/22 0242  WBC 11.3* 8.8 9.2  HGB 6.7* 6.1* 8.6*  HCT 25.1* 23.0* 29.8*  PLT 402* 365 356   BMET Recent Labs    06/19/22 2005  06/20/22 1234 06/21/22 0242  NA 138 136 139  K 3.4* 3.5 3.5  CL 108 109 112*  CO2 21* 21* 20*  GLUCOSE 88 103* 92  BUN _0 CREATININE 0.63 0.76 0.61  CALCIUM 9.1 8.5* 8.7*   LFT Recent Labs    06/21/22 0242  PROT 8.1  ALBUMIN 3.3*  AST 16  ALT 9  ALKPHOS 52  BILITOT 1.0   PT/INR Recent Labs    06/20/22 1402  LABPROT 15.4*  INR 1.2     Studies/Results: DG Hand Complete Right  Result Date: 06/20/2022 CLINICAL DATA:  Pt reported right posterior hand swelling for a few days with pain. Pt denied any injury. EXAM: RIGHT HAND - COMPLETE 3+ VIEW COMPARISON:  None Available. FINDINGS: No fracture or bone lesion. Joints are normally spaced and aligned.  No arthropathic changes. Dorsal soft tissue swelling. No soft tissue  air. No radiopaque foreign body. IMPRESSION: 1. No fracture, bone lesion or joint abnormality. 2. Dorsal soft tissue swelling. Electronically Signed   By: Lajean Manes M.D.   On: 06/20/2022 18:38    Impression/Plan: -Severe anemia with hemoglobin down to 6.1.  Intermittent rectal bleeding for 1 year.  History of reflux and NSAID use. -History of uterine fibroid and menorrhagia. -Rash - Looks like she has developed worsening rash since hospital admission.  Could be related to Protonix infusion.  Currently on p.o. Protonix.  Recommendation ------------------------ -Plan for EGD and colonoscopy tomorrow. -ok to  have clear liquids diet today.  Keep n.p.o. past midnight. -Avoid NSAIDs -Okay to DC Protonix for questionable PPI related rash  Risks (bleeding, infection, bowel perforation that could require surgery, sedation-related changes in cardiopulmonary systems), benefits (identification and possible treatment of source of symptoms, exclusion of certain causes of symptoms), and alternatives (watchful waiting, radiographic imaging studies, empiric medical treatment)  were explained to patient/family in detail and patient wishes to proceed.     LOS: 1 day    Otis Brace  MD, FACP 06/21/2022, 10:29 AM  Contact #  [phone]

## 2022-06-22 ENCOUNTER — Encounter (HOSPITAL_COMMUNITY): Payer: Self-pay | Admitting: Internal Medicine

## 2022-06-22 ENCOUNTER — Inpatient Hospital Stay (HOSPITAL_COMMUNITY): Payer: 59 | Admitting: Anesthesiology

## 2022-06-22 ENCOUNTER — Encounter (HOSPITAL_COMMUNITY)
Admission: EM | Disposition: A | Discharge status: Home / Self Care | Payer: Self-pay | Source: Home / Self Care | Attending: Internal Medicine

## 2022-06-22 ENCOUNTER — Ambulatory Visit: Payer: 59 | Admitting: Student

## 2022-06-22 DIAGNOSIS — K529 Noninfective gastroenteritis and colitis, unspecified: Secondary | ICD-10-CM

## 2022-06-22 DIAGNOSIS — Z6841 Body Mass Index (BMI) 40.0 and over, adult: Secondary | ICD-10-CM

## 2022-06-22 DIAGNOSIS — K922 Gastrointestinal hemorrhage, unspecified: Secondary | ICD-10-CM | POA: Diagnosis not present

## 2022-06-22 DIAGNOSIS — K649 Unspecified hemorrhoids: Secondary | ICD-10-CM

## 2022-06-22 DIAGNOSIS — D649 Anemia, unspecified: Secondary | ICD-10-CM

## 2022-06-22 HISTORY — PX: COLONOSCOPY: SHX5424

## 2022-06-22 HISTORY — PX: ESOPHAGOGASTRODUODENOSCOPY (EGD) WITH PROPOFOL: SHX5813

## 2022-06-22 HISTORY — PX: BIOPSY: SHX5522

## 2022-06-22 LAB — CBC
HCT: 30.5 % — ABNORMAL LOW (ref 36.0–46.0)
Hemoglobin: 8.8 g/dL — ABNORMAL LOW (ref 12.0–15.0)
MCH: 20 pg — ABNORMAL LOW (ref 26.0–34.0)
MCHC: 28.9 g/dL — ABNORMAL LOW (ref 30.0–36.0)
MCV: 69.2 fL — ABNORMAL LOW (ref 80.0–100.0)
Platelets: 350 10*3/uL (ref 150–400)
RBC: 4.41 MIL/uL (ref 3.87–5.11)
RDW: 26 % — ABNORMAL HIGH (ref 11.5–15.5)
WBC: 8.1 10*3/uL (ref 4.0–10.5)
nRBC: 0 % (ref 0.0–0.2)

## 2022-06-22 LAB — ANA W/REFLEX IF POSITIVE: Anti Nuclear Antibody (ANA): NEGATIVE

## 2022-06-22 LAB — BASIC METABOLIC PANEL
Anion gap: 9 (ref 5–15)
BUN: 7 mg/dL (ref 6–20)
CO2: 20 mmol/L — ABNORMAL LOW (ref 22–32)
Calcium: 8.5 mg/dL — ABNORMAL LOW (ref 8.9–10.3)
Chloride: 106 mmol/L (ref 98–111)
Creatinine, Ser: 0.76 mg/dL (ref 0.44–1.00)
GFR, Estimated: >60 mL/min (ref >60)
Glucose, Bld: 95 mg/dL (ref 70–99)
Potassium: 3.6 mmol/L (ref 3.5–5.1)
Sodium: 135 mmol/L (ref 135–145)

## 2022-06-22 SURGERY — ESOPHAGOGASTRODUODENOSCOPY (EGD) WITH PROPOFOL
Anesthesia: Monitor Anesthesia Care

## 2022-06-22 MED ORDER — PHENYLEPHRINE HCL (PRESSORS) 10 MG/ML IV SOLN
INTRAVENOUS | Status: AC
  Filled 2022-06-22: qty 1

## 2022-06-22 MED ORDER — MENTHOL 3 MG MT LOZG
1.0000 | LOZENGE | OROMUCOSAL | Status: DC | PRN
  Administered 2022-06-22: 3 mg via ORAL
  Filled 2022-06-22: qty 9

## 2022-06-22 MED ORDER — ROCURONIUM BROMIDE 100 MG/10ML IV SOLN
INTRAVENOUS | Status: DC | PRN
  Administered 2022-06-22: 50 mg via INTRAVENOUS

## 2022-06-22 MED ORDER — MIDAZOLAM HCL 5 MG/5ML IJ SOLN
INTRAMUSCULAR | Status: DC | PRN
  Administered 2022-06-22: 2 mg via INTRAVENOUS

## 2022-06-22 MED ORDER — SUCCINYLCHOLINE CHLORIDE 200 MG/10ML IV SOSY
PREFILLED_SYRINGE | INTRAVENOUS | Status: DC | PRN
  Administered 2022-06-22: 120 mg via INTRAVENOUS

## 2022-06-22 MED ORDER — MIDAZOLAM HCL 2 MG/2ML IJ SOLN
INTRAMUSCULAR | Status: AC
  Filled 2022-06-22: qty 2

## 2022-06-22 MED ORDER — PREDNISONE 20 MG PO TABS
40.0000 mg | ORAL_TABLET | Freq: Every day | ORAL | Status: DC
  Administered 2022-06-22 – 2022-06-24 (×3): 40 mg via ORAL
  Filled 2022-06-22 (×3): qty 2

## 2022-06-22 MED ORDER — PROPOFOL 10 MG/ML IV BOLUS
INTRAVENOUS | Status: AC
  Filled 2022-06-22: qty 20

## 2022-06-22 MED ORDER — ONDANSETRON HCL 4 MG/2ML IJ SOLN
INTRAMUSCULAR | Status: DC | PRN
  Administered 2022-06-22: 4 mg via INTRAVENOUS

## 2022-06-22 MED ORDER — PROPOFOL 500 MG/50ML IV EMUL
INTRAVENOUS | Status: DC | PRN
  Administered 2022-06-22: 100 mg via INTRAVENOUS

## 2022-06-22 MED ORDER — LACTATED RINGERS IV SOLN
INTRAVENOUS | Status: AC | PRN
  Administered 2022-06-22: 1000 mL via INTRAVENOUS

## 2022-06-22 MED ORDER — SUGAMMADEX SODIUM 500 MG/5ML IV SOLN
INTRAVENOUS | Status: DC | PRN
  Administered 2022-06-22: 400 mg via INTRAVENOUS

## 2022-06-22 SURGICAL SUPPLY — 25 items
BLOCK BITE 60FR ADLT L/F BLUE (MISCELLANEOUS) ×4 IMPLANT
ELECT REM PT RETURN 9FT ADLT (ELECTROSURGICAL)
ELECTRODE REM PT RTRN 9FT ADLT (ELECTROSURGICAL) IMPLANT
FCP BXJMBJMB 240X2.8X (CUTTING FORCEPS)
FLOOR PAD 36X40 (MISCELLANEOUS) ×3
FORCEP RJ3 GP 1.8X160 W-NEEDLE (CUTTING FORCEPS) IMPLANT
FORCEPS BIOP RAD 4 LRG CAP 4 (CUTTING FORCEPS) IMPLANT
FORCEPS BIOP RJ4 240 W/NDL (CUTTING FORCEPS)
FORCEPS BXJMBJMB 240X2.8X (CUTTING FORCEPS) IMPLANT
INJECTOR/SNARE I SNARE (MISCELLANEOUS) IMPLANT
LUBRICANT JELLY 4.5OZ STERILE (MISCELLANEOUS) IMPLANT
MANIFOLD NEPTUNE II (INSTRUMENTS) IMPLANT
NDL SCLEROTHERAPY 25GX240 (NEEDLE) IMPLANT
NEEDLE SCLEROTHERAPY 25GX240 (NEEDLE) IMPLANT
PAD FLOOR 36X40 (MISCELLANEOUS) ×3 IMPLANT
PROBE APC STR FIRE (PROBE) IMPLANT
PROBE INJECTION GOLD (MISCELLANEOUS)
PROBE INJECTION GOLD 7FR (MISCELLANEOUS) IMPLANT
SNARE ROTATE MED OVAL 20MM (MISCELLANEOUS) IMPLANT
SNARE SHORT THROW 13M SML OVAL (MISCELLANEOUS) IMPLANT
SYR 50ML LL SCALE MARK (SYRINGE) IMPLANT
TRAP SPECIMEN MUCOUS 40CC (MISCELLANEOUS) IMPLANT
TUBING ENDO SMARTCAP PENTAX (MISCELLANEOUS) ×8 IMPLANT
TUBING IRRIGATION ENDOGATOR (MISCELLANEOUS) ×8 IMPLANT
WATER STERILE IRR 1000ML POUR (IV SOLUTION) IMPLANT

## 2022-06-22 NOTE — Anesthesia Procedure Notes (Signed)
Procedure Name: Intubation Date/Time: 06/22/2022 11:50 AM  Performed by: Lavina Hamman, CRNAPre-anesthesia Checklist: Patient identified, Emergency Drugs available, Suction available, Patient being monitored and Timeout performed Patient Re-evaluated:Patient Re-evaluated prior to induction Oxygen Delivery Method: Circle system utilized Preoxygenation: Pre-oxygenation with 100% oxygen Induction Type: IV induction Ventilation: Mask ventilation without difficulty Laryngoscope Size: Mac and 3 Grade View: Grade I Tube type: Oral Tube size: 7.5 mm Number of attempts: 1 Airway Equipment and Method: Stylet Placement Confirmation: ETT inserted through vocal cords under direct vision, positive ETCO2, CO2 detector and breath sounds checked- equal and bilateral Secured at: 21 cm Tube secured with: Tape Dental Injury: Teeth and Oropharynx as per pre-operative assessment  Comments: ATOI

## 2022-06-22 NOTE — Anesthesia Preprocedure Evaluation (Signed)
Anesthesia Evaluation  Patient identified by MRN, date of birth, ID band Patient awake    Reviewed: Allergy & Precautions, NPO status , Patient's Chart, lab work & pertinent test results  History of Anesthesia Complications Negative for: history of anesthetic complications  Airway Mallampati: III  TM Distance: >3 FB Neck ROM: Full    Dental  (+) Teeth Intact, Dental Advisory Given   Pulmonary neg pulmonary ROS,    breath sounds clear to auscultation       Cardiovascular negative cardio ROS   Rhythm:Regular     Neuro/Psych negative neurological ROS     GI/Hepatic Neg liver ROS, Lab Results      Component                Value               Date                      ALT                      9                   06/21/2022                AST                      16                  06/21/2022                ALKPHOS                  52                  06/21/2022                BILITOT                  1.0                 06/21/2022            ? GI bleed   Endo/Other  Morbid obesity  Renal/GU Lab Results      Component                Value               Date                      CREATININE               0.76                06/22/2022                Musculoskeletal   Abdominal   Peds  Hematology  (+) Blood dyscrasia, anemia ,   Anesthesia Other Findings W/u for autoimmune  Reproductive/Obstetrics                             Anesthesia Physical Anesthesia Plan  ASA: 3  Anesthesia Plan: MAC   Post-op Pain Management: Minimal or no pain anticipated   Induction:   PONV Risk Score and Plan: 2 and Propofol infusion and Treatment may vary due to age or medical condition  Airway Management  Planned: Nasal Cannula, Simple Face Mask and Natural Airway  Additional Equipment: None  Intra-op Plan:   Post-operative Plan:   Informed Consent: I have reviewed the patients History and  Physical, chart, labs and discussed the procedure including the risks, benefits and alternatives for the proposed anesthesia with the patient or authorized representative who has indicated his/her understanding and acceptance.     Dental advisory given  Plan Discussed with: CRNA  Anesthesia Plan Comments:         Anesthesia Quick Evaluation

## 2022-06-22 NOTE — Progress Notes (Signed)
   06/22/22 1251  Assess: MEWS Score  Temp 97.9 F (36.6 C)  BP (!) 153/103  MAP (mmHg) 118  Pulse Rate (!) 115  Resp 19  Assess: MEWS Score  MEWS Temp 0  MEWS Systolic 0  MEWS Pulse 2  MEWS RR 0  MEWS LOC 0  MEWS Score 2  MEWS Score Color Yellow  Assess: if the MEWS score is Yellow or Red  Were vital signs taken at a resting state? Yes  Focused Assessment No change from prior assessment  Does the patient meet 2 or more of the SIRS criteria? No  MEWS guidelines implemented *See Row Information* No, previously yellow, continue vital signs every 4 hours  Treat  MEWS Interventions Other (Comment)  Pain Scale 0-10  Pain Score 0  Take Vital Signs  Increase Vital Sign Frequency  Yellow: Q 2hr X 2 then Q 4hr X 2, if remains yellow, continue Q 4hrs  Escalate  MEWS: Escalate Yellow: discuss with charge nurse/RN and consider discussing with provider and RRT  Notify: Charge Nurse/RN  Name of Charge Nurse/RN Notified Environmental consultant  Date Charge Nurse/RN Notified 06/22/22  Time Charge Nurse/RN Notified 1300  Notify: Provider  Provider Name/Title Gherghe MD  Date Provider Notified 06/22/22  Time Provider Notified 1300  Method of Notification Page  Notification Reason Other (Comment) (Update)  Provider response In department  Date of Provider Response 06/22/22  Time of Provider Response 1315  Notify: Rapid Response  Name of Rapid Response RN Notified  (no)  Document  Progress note created (see row info) Yes  Assess: SIRS CRITERIA  SIRS Temperature  0  SIRS Pulse 1  SIRS Respirations  0  SIRS WBC 1  SIRS Score Sum  2   Post EGD/Colonoscopy, had general anesthesia in Endo. Alert, awake, able to communicate needs.

## 2022-06-22 NOTE — Op Note (Signed)
[facility] Patient Name: Dana Campbell Procedure Date: 06/22/2022 MRN: [id number] Attending MD: Lear Ng , MD Date of Birth: 07-28-1981 CSN: [id number] Age: 41 Admit Type: Outpatient Procedure:                Colonoscopy Indications:              This is the patient's first colonoscopy,                            Hematochezia, Acute post hemorrhagic anemia Providers:                Lear Ng, MD, Jaci Carrel, RN,                            Despina Pole, Technician Referring MD:             hospital team Medicines:                Propofol per Anesthesia, General Anesthesia Complications:            No immediate complications. Estimated Blood Loss:     Estimated blood loss was minimal. Procedure:                Pre-Anesthesia Assessment:                           - Prior to the procedure, a History and Physical                            was performed, and patient medications and                            allergies were reviewed. The patient's tolerance of                            previous anesthesia was also reviewed. The risks                            and benefits of the procedure and the sedation                            options and risks were discussed with the patient.                            All questions were answered, and informed consent                            was obtained. Prior Anticoagulants: The patient has                            taken no previous anticoagulant or antiplatelet                            agents. ASA Grade Assessment: III - A patient with  severe systemic disease. After reviewing the risks                            and benefits, the patient was deemed in                            satisfactory condition to undergo the procedure.                           After obtaining informed consent, the colonoscope                            was passed under direct vision.  Throughout the                            procedure, the patient's blood pressure, pulse, and                            oxygen saturations were monitored continuously. The                            [id number]-HQ190L ([id number]) Olympus colonoscope was                            introduced through the anus and advanced to the the                            cecum, identified by appendiceal orifice and                            ileocecal valve. The colonoscopy was performed with                            difficulty due to inadequate bowel prep,                            significant looping and a tortuous colon.                            Successful completion of the procedure was aided by                            straightening and shortening the scope to obtain                            bowel loop reduction, using scope torsion, applying                            abdominal pressure and lavage. The patient                            tolerated the procedure well. The quality of the  bowel preparation was inadequate. The ileocecal                            valve, appendiceal orifice, and rectum were                            photographed. Scope In: 11:51:41 AM Scope Out: 29:52:84 PM Scope Withdrawal Time: 0 hours 5 minutes 59 seconds  Total Procedure Duration: 0 hours 15 minutes 0 seconds  Findings:      The perianal and digital rectal examinations were normal.      A diffuse area of moderately congested, erythematous, eroded, granular,       ulcerated and vascular-pattern-decreased mucosa was found in the entire       colon. Biopsies were taken with a cold forceps for histology. Estimated       blood loss was minimal.      Extensive amounts of liquid semi-liquid stool was found in the entire       colon, interfering with visualization.      Internal hemorrhoids were found during retroflexion. The hemorrhoids       were medium-sized and Grade I (internal  hemorrhoids that do not       prolapse). Impression:               - Preparation of the colon was inadequate.                           - Congested, erythematous, eroded, granular,                            ulcerated and vascular-pattern-decreased mucosa in                            the entire examined colon. Biopsied.                           - Stool in the entire examined colon.                           - Internal hemorrhoids. Moderate Sedation:      N/A - MAC procedure Recommendation:           - Clear liquid diet.                           - Repeat colonoscopy for surveillance based on                            pathology results.                           - Await pathology results. Procedure Code(s):        --- Professional ---                           [phone], Colonoscopy, flexible; with biopsy, single                            or multiple Diagnosis Code(s):        ---  Professional ---                           K92.1, Melena (includes Hematochezia)                           D62, Acute posthemorrhagic anemia                           K63.3, Ulcer of intestine                           K63.89, Other specified diseases of intestine                           K64.0, First degree hemorrhoids CPT copyright 2019 [employer]. All rights reserved. The codes documented in this report are preliminary and upon coder review may  be revised to meet current compliance requirements. Lear Ng, MD 06/22/2022 12:27:17 PM This report has been signed electronically. Number of Addenda: 0

## 2022-06-22 NOTE — Op Note (Addendum)
[facility] Patient Name: Dana Campbell Procedure Date: 06/22/2022 MRN: [id number] Attending MD: Lear Ng , MD Date of Birth: 02-Sep-1981 CSN: [id number] Age: 41 Admit Type: Inpatient Procedure:                Upper GI endoscopy Indications:              Acute post hemorrhagic anemia, Hematochezia Providers:                Lear Ng, MD, Jaci Carrel, RN,                            Despina Pole, Technician Referring MD:             hospital team Medicines:                Propofol per Anesthesia, General Anesthesia Complications:            No immediate complications. Estimated Blood Loss:     Estimated blood loss was minimal. Procedure:                Pre-Anesthesia Assessment:                           - Prior to the procedure, a History and Physical                            was performed, and patient medications and                            allergies were reviewed. The patient's tolerance of                            previous anesthesia was also reviewed. The risks                            and benefits of the procedure and the sedation                            options and risks were discussed with the patient.                            All questions were answered, and informed consent                            was obtained. Prior Anticoagulants: The patient has                            taken no previous anticoagulant or antiplatelet                            agents. ASA Grade Assessment: III - A patient with                            severe systemic disease. After reviewing the risks  and benefits, the patient was deemed in                            satisfactory condition to undergo the procedure.                           After obtaining informed consent, the endoscope was                            passed under direct vision. Throughout the                            procedure, the patient's blood  pressure, pulse, and                            oxygen saturations were monitored continuously. The                            [id number]-[id number] ([id number]) Olympus endoscope was introduced                            through the mouth, and advanced to the second part                            of duodenum. The upper GI endoscopy was                            accomplished without difficulty. The patient                            tolerated the procedure well. Scope In: Scope Out: Findings:      The examined esophagus was normal.      The Z-line was regular and was found 40 cm from the incisors.      Diffuse moderate inflammation with hemorrhage characterized by       congestion (edema) and erythema was found in the entire examined       stomach. Biopsies were taken with a cold forceps for histology.       Estimated blood loss was minimal.      Patchy mild mucosal changes characterized by congestion and erythema       were found in the duodenal bulb.      The exam of the duodenum was otherwise normal. Impression:               - Normal esophagus.                           - Z-line regular, 40 cm from the incisors.                           - Gastritis with hemorrhage. Biopsied.                           - Mucosal changes in the duodenum. Moderate Sedation:      N/A - MAC procedure Recommendation:           -  Clear liquid diet.                           - Observe patient's clinical course.                           - Await pathology results. Procedure Code(s):        --- Professional ---                           [phone], Esophagogastroduodenoscopy, flexible,                            transoral; with biopsy, single or multiple Diagnosis Code(s):        --- Professional ---                           D62, Acute posthemorrhagic anemia                           K29.71, Gastritis, unspecified, with bleeding                           K31.89, Other diseases of stomach and duodenum                            K92.1, Melena (includes Hematochezia) CPT copyright 2019 American Medical Association. All rights reserved. The codes documented in this report are preliminary and upon coder review may  be revised to meet current compliance requirements. Lear Ng, MD 06/22/2022 12:20:29 PM This report has been signed electronically. Number of Addenda: 0

## 2022-06-22 NOTE — Progress Notes (Signed)
PROGRESS NOTE  Dana Campbell BJS:[id number] DOB: Apr 16, 1981 DOA: 06/20/2022 PCP: Precious Gilding, DO   LOS: 2 days   Brief Narrative / Interim history: 41 y.o. female with medical history significant of menorrhagia, fibroids. Presenting with abnormal labs. She reports that she had right hand swelling for a couple of days and BLE swelling for a couple of weeks. She has no previous history of HF. She decided to go to urgent care yesterday to get checked out. She was given toradol for her hand and sent home. Labs were drawn as part of her work up. When they resulted, she was notified that her Hgb level was low and it was recommended that she go to the ED for evaluation. The patient notes that she has had some BRBPR intermittently over the last couple of weeks. She did not have any dark stools  Subjective / 24h Interval events: Complains of bilateral wrist pain this morning.  No new rashes that she has seen  Assesement and Plan: Principal Problem:   GIB (gastrointestinal bleeding) Active Problems:   Symptomatic anemia   Iron deficiency anemia   Swelling of lower extremity   Swelling of right hand   Obesity, Class III, BMI 40-49.9 (morbid obesity) ([place])  Principal problem GI bleed, symptomatic anemia-GI consulted, appreciate input.  She was transfused units of packed red blood cells with adequate improvement in her hemoglobin, and now has remained stable.  She is getting an endoscopy and colonoscopy today  Active problems Bilateral lower extremity swelling, intermittent pain-states that her swelling may be a little bit better today.  BNP is normal going against heart failure but BNP can be normal in patients with obesity.  She has been having intermittent joint pain, which may suggest an autoimmune process  Bilateral hand swelling-unclear etiology.  There is no trauma, and is not tender but has some stiffness in forming a fist.  This may potentially represent an autoimmune illness.  ANA with  reflex was sent and is pending.  Uric acid is negative, and given lack of significant tenderness with palpation gout is unlikely.  She has elevated inflammatory markers with sed rate and CRP.  Would like to avoid steroids in the setting of GI bleed, but may need to consider starting steroids this afternoon if her colonoscopy and endoscopy look good  Acute blood loss anemia, iron deficiency anemia, B12 deficiency, microcytic anemia-anemia panel shows significant iron deficiency. B12 was quite low as well at 182.  Continue iron and B12 supplements  Skin rash-patient developed a rash on her forearms, back, after the blood transfusion which may represent a transfusion reaction.  Odd thing is that she has been having intermittent areas of similar rashes coming and going on her legs, abdomen, back.  No new lesions.  Monitor  Fibroids, heavy menses-likely etiology for her iron deficiency anemia.  Outpatient follow-up with OB/GYN  Obesity, morbid-BMI 49.6.  She would benefit from weight loss   Scheduled Meds:  cyanocobalamin  1,000 mcg Intramuscular Daily   ferrous gluconate  324 mg Oral TID WC   loratadine  10 mg Oral Daily   Continuous Infusions:   PRN Meds:.acetaminophen **OR** acetaminophen, diphenhydrAMINE, mouth rinse, oxyCODONE  Diet Orders (From admission, onward)     Start     Ordered   06/22/22 0001  Diet NPO time specified  Diet effective midnight        06/21/22 1102            DVT prophylaxis: SCDs Start: 06/20/22 2048  Lab Results  Component Value Date   PLT 350 06/22/2022      Code Status: Full Code  Status is: Inpatient Remains inpatient appropriate because: Persistent symptoms, new rash   Level of care: Telemetry  Consultants:  GI   Objective: Vitals:   06/21/22 0842 06/21/22 1327 06/21/22 2039 06/22/22 0435  BP: 129/75 (!) 141/84 (!) 160/81 131/89  Pulse: (!) 106 (!) 108 (!) 106 (!) 110  Resp: '17 17 17 17  '$ Temp: 99.1 F (37.3 C) 98.5 F (36.9  C) 98.6 F (37 C) 98.8 F (37.1 C)  TempSrc:      SpO2: 100% 100% 99% 100%  Weight:      Height:        Intake/Output Summary (Last 24 hours) at 06/22/2022 1007 Last data filed at 06/22/2022 1749 Gross per 24 hour  Intake 890 ml  Output --  Net 890 ml    Wt Readings from Last 3 Encounters:  06/20/22 131.1 kg  01/10/21 (!) 141.1 kg  12/01/19 (!) 137.4 kg    Examination:  Constitutional: NAD Eyes: lids and conjunctivae normal, no scleral icterus ENMT: mmm Neck: normal, supple Respiratory: clear to auscultation bilaterally, no wheezing, no crackles. Normal respiratory effort.  Cardiovascular: Regular rate and rhythm, no murmurs / rubs / gallops.  Abdomen: soft, no distention, no tenderness. Bowel sounds positive.  Skin: no new rashes, old arm rash starting to fade Neurologic: no focal deficits, equal strength  Data Reviewed: I have independently reviewed following labs and imaging studies  CBC Recent Labs  Lab 06/19/22 2005 06/20/22 1234 06/21/22 0242 06/21/22 1039 06/22/22 0601  WBC 11.3* 8.8 9.2  --  8.1  HGB 6.7* 6.1* 8.6* 8.7* 8.8*  HCT 25.1* 23.0* 29.8* 30.5* 30.5*  PLT 402* 365 356  --  350  MCV 62.3* 63.2* 68.8*  --  69.2*  MCH 16.6* 16.8* 19.9*  --  20.0*  MCHC 26.7* 26.5* 28.9*  --  28.9*  RDW 21.9* 21.9* 25.7*  --  26.0*  LYMPHSABS  --  0.9  --   --   --   MONOABS  --  0.7  --   --   --   EOSABS  --  0.4  --   --   --   BASOSABS  --  0.0  --   --   --      Recent Labs  Lab 06/19/22 2005 06/20/22 1234 06/20/22 1402 06/20/22 2034 06/21/22 0242 06/22/22 0601  NA 138 136  --   --  139 135  K 3.4* 3.5  --   --  3.5 3.6  CL 108 109  --   --  112* 106  CO2 21* 21*  --   --  20* 20*  GLUCOSE 88 103*  --   --  92 95  BUN 7 10  --   --  7 7  CREATININE 0.63 0.76  --   --  0.61 0.76  CALCIUM 9.1 8.5*  --   --  8.7* 8.5*  AST  --  17  --   --  16  --   ALT  --  7  --   --  9  --   ALKPHOS  --  52  --   --  52  --   BILITOT  --  0.4  --   --   1.0  --   ALBUMIN  --  3.4*  --   --  3.3*  --  CRP 2.5*  --   --   --   --   --   INR  --   --  1.2  --   --   --   BNP  --   --   --  25.6  --   --      ------------------------------------------------------------------------------------------------------------------ No results for input(s): "CHOL", "HDL", "LDLCALC", "TRIG", "CHOLHDL", "LDLDIRECT" in the last 72 hours.  No results found for: "HGBA1C" ------------------------------------------------------------------------------------------------------------------ No results for input(s): "TSH", "T4TOTAL", "T3FREE", "THYROIDAB" in the last 72 hours.  Invalid input(s): "FREET3"  Cardiac Enzymes No results for input(s): "CKMB", "TROPONINI", "MYOGLOBIN" in the last 168 hours.  Invalid input(s): "CK" ------------------------------------------------------------------------------------------------------------------    Component Value Date/Time   BNP 25.6 06/20/2022 2034    CBG: No results for input(s): "GLUCAP" in the last 168 hours.  No results found for this or any previous visit (from the past 240 hour(s)).   Radiology Studies: No results found.   Marzetta Board, MD, PhD [facility]  Between 7 am - 7 pm I am available, please contact me via Amion (for emergencies) or Securechat (non urgent messages)  Between 7 pm - 7 am I am not available, please contact night coverage MD/APP via Amion

## 2022-06-22 NOTE — Transfer of Care (Signed)
Immediate Anesthesia Transfer of Care Note  Patient: ANDILYN BETTCHER  Procedure(s) Performed: Procedure(s): ESOPHAGOGASTRODUODENOSCOPY (EGD) WITH PROPOFOL (N/A) COLONOSCOPY WITH PROPOFOL (N/A) BIOPSY COLONOSCOPY (N/A)  Patient Location: PACU  Anesthesia Type:General  Level of Consciousness:  sedated, patient cooperative and responds to stimulation  Airway & Oxygen Therapy:Patient Spontanous Breathing and Patient connected to face mask oxgen  Post-op Assessment:  Report given to PACU RN and Post -op Vital signs reviewed and stable  Post vital signs:  Reviewed and stable  Last Vitals:  Vitals:   06/22/22 1013 06/22/22 1217  BP: (!) 160/82 (!) 134/48  Pulse:  (!) 119  Resp: (!) 24 20  Temp: 37 C   SpO2: 747% 18%    Complications: No apparent anesthesia complications

## 2022-06-22 NOTE — Interval H&P Note (Signed)
History and Physical Interval Note:  06/22/2022 11:11 AM  Dana Campbell  has presented today for surgery, with the diagnosis of Reflux, GI bleed, anemia.  The various methods of treatment have been discussed with the patient and family. After consideration of risks, benefits and other options for treatment, the patient has consented to  Procedure(s): ESOPHAGOGASTRODUODENOSCOPY (EGD) WITH PROPOFOL (N/A) COLONOSCOPY WITH PROPOFOL (N/A) as a surgical intervention.  The patient's history has been reviewed, patient examined, no change in status, stable for surgery.  I have reviewed the patient's chart and labs.  Questions were answered to the patient's satisfaction.     Lear Ng

## 2022-06-23 ENCOUNTER — Encounter (HOSPITAL_COMMUNITY): Payer: Self-pay | Admitting: Gastroenterology

## 2022-06-23 DIAGNOSIS — K922 Gastrointestinal hemorrhage, unspecified: Secondary | ICD-10-CM | POA: Diagnosis not present

## 2022-06-23 LAB — BASIC METABOLIC PANEL
Anion gap: 9 (ref 5–15)
BUN: <5 mg/dL — ABNORMAL LOW (ref 6–20)
CO2: 21 mmol/L — ABNORMAL LOW (ref 22–32)
Calcium: 9.2 mg/dL (ref 8.9–10.3)
Chloride: 108 mmol/L (ref 98–111)
Creatinine, Ser: 0.63 mg/dL (ref 0.44–1.00)
GFR, Estimated: >60 mL/min (ref >60)
Glucose, Bld: 144 mg/dL — ABNORMAL HIGH (ref 70–99)
Potassium: 3.9 mmol/L (ref 3.5–5.1)
Sodium: 138 mmol/L (ref 135–145)

## 2022-06-23 LAB — CBC
HCT: 29.7 % — ABNORMAL LOW (ref 36.0–46.0)
Hemoglobin: 8.4 g/dL — ABNORMAL LOW (ref 12.0–15.0)
MCH: 19.7 pg — ABNORMAL LOW (ref 26.0–34.0)
MCHC: 28.3 g/dL — ABNORMAL LOW (ref 30.0–36.0)
MCV: 69.6 fL — ABNORMAL LOW (ref 80.0–100.0)
Platelets: 321 10*3/uL (ref 150–400)
RBC: 4.27 MIL/uL (ref 3.87–5.11)
RDW: 26.7 % — ABNORMAL HIGH (ref 11.5–15.5)
WBC: 7.4 10*3/uL (ref 4.0–10.5)
nRBC: 0 % (ref 0.0–0.2)

## 2022-06-23 NOTE — Progress Notes (Signed)
  Transition of Care [facility]) Screening Note   Patient Details  Name: Dana Campbell Date of Birth: 05/27/1981   Transition of Care [facility]) CM/SW Contact:    Vassie Moselle, LCSW Phone Number: 06/23/2022, 12:58 PM    Transition of Care Department [facility]) has reviewed patient and no TOC needs have been identified at this time. We will continue to monitor patient advancement through interdisciplinary progression rounds. If new patient transition needs arise, please place a TOC consult.

## 2022-06-23 NOTE — Progress Notes (Signed)
[facility] Gastroenterology Progress Note  Dana Campbell 41 y.o. 16-Nov-1981   Subjective: Patient examined sitting in bed.  Patient denies abdominal pain.  Denies any further episodes of rectal bleeding.  She started her menstrual cycle today.  Tolerating clear liquid diet well.  ROS : Review of Systems  Gastrointestinal:  Negative for abdominal pain, blood in stool, constipation, diarrhea, heartburn, melena, nausea and vomiting.  Genitourinary:  Negative for dysuria and urgency.      Objective: Vital signs in last 24 hours: Vitals:   06/22/22 2256 06/23/22 0501  BP: (!) 147/80 140/80  Pulse: (!) 106 (!) 103  Resp: 20 14  Temp: 98.8 F (37.1 C) 98.2 F (36.8 C)  SpO2: 98% 100%    Physical Exam:  General:  Alert, cooperative, no distress, appears stated age  Head:  Normocephalic, without obvious abnormality, atraumatic  Eyes:  Anicteric sclera, EOM's intact  Lungs:   Clear to auscultation bilaterally, respirations unlabored  Heart:  Regular rate and rhythm, S1, S2 normal  Abdomen:   Soft, non-tender, bowel sounds active all four quadrants,  no masses,   Extremities: Extremities normal, atraumatic, no  edema  Pulses: 2+ and symmetric    Lab Results: Recent Labs    06/22/22 0601 06/23/22 0452  NA 135 138  K 3.6 3.9  CL 106 108  CO2 20* 21*  GLUCOSE 95 144*  BUN 7 <5*  CREATININE 0.76 0.63  CALCIUM 8.5* 9.2   Recent Labs    06/20/22 1234 06/21/22 0242  AST 17 16  ALT 7 9  ALKPHOS 52 52  BILITOT 0.4 1.0  PROT 8.1 8.1  ALBUMIN 3.4* 3.3*   Recent Labs    06/20/22 1234 06/21/22 0242 06/22/22 0601 06/23/22 0452  WBC 8.8   < > 8.1 7.4  NEUTROABS 6.8  --   --   --   HGB 6.1*   < > 8.8* 8.4*  HCT 23.0*   < > 30.5* 29.7*  MCV 63.2*   < > 69.2* 69.6*  PLT 365   < > 350 321   < > = values in this interval not displayed.   Recent Labs    06/20/22 1402  LABPROT 15.4*  INR 1.2      Assessment GI bleed Anemia Gastritis  Colonoscopy 06/22/2022 -  Preparation of the colon was inadequate. - Congested, erythematous, eroded, granular, ulcerated and vascular-pattern-decreased mucosa in the entire examined colon. Biopsied. - Stool in the entire examined colon. - Internal hemorrhoids.  EGD 06/22/2022 - Normal esophagus. - Z-line regular, 40 cm from the incisors. - Gastritis with hemorrhage. Biopsied. - Mucosal changes in the duodenum.  Awaiting biopsy results.  Possible hematochezia due to IBD.  Patient denies further episodes of rectal bleeding.  She was started on prednisone 40 mg daily. Hemoglobin stable at 8.4(8.8) Patient is not currently taking Protonix due to rash was possibly due to Protonix drip.   Plan: Continue prednisone 40 mg daily. Consider advancing diet. Continue to monitor hemoglobin,  transfuse if less than 7. Continue iron 3 times daily. Recommend avoiding NSAIDs. [facility] will follow.   Arvella Nigh Perel Hauschild PA-C 06/23/2022, 9:02 AM  Contact #  [phone]

## 2022-06-23 NOTE — Progress Notes (Signed)
PROGRESS NOTE  Dana Campbell PTW:[id number] DOB: [birth year] DOA: 06/20/2022 PCP: Precious Gilding, DO   LOS: 3 days   Brief Narrative / Interim history: 41 y.o. female with medical history significant of menorrhagia, fibroids. Presenting with abnormal labs. She reports that she had right hand swelling for a couple of days and BLE swelling for a couple of weeks. She has no previous history of HF. She decided to go to urgent care yesterday to get checked out. She was given toradol for her hand and sent home. Labs were drawn as part of her work up. When they resulted, she was notified that her Hgb level was low and it was recommended that she go to the ED for evaluation. The patient notes that she has had some BRBPR intermittently over the last couple of weeks. She did not have any dark stools.  GI consulted and patient underwent colonoscopy and upper endoscopy on 7/24.  She was found to have gastritis and also: Findings suspicious for ulcerative colitis  Subjective / 24h Interval events: Her wrist pain seems to be better this morning, swelling is gone down.  Not as stiff in her hands.  Assesement and Plan: Principal Problem:   GIB (gastrointestinal bleeding) Active Problems:   Symptomatic anemia   Iron deficiency anemia   Swelling of lower extremity   Swelling of right hand   Obesity, Class III, BMI 40-49.9 (morbid obesity) ([place])  Principal problem GI bleed, symptomatic anemia-GI consulted, appreciate input.  She was transfused 2 units of packed red blood cells with adequate improvement in her hemoglobin, and hemoglobin has remained stable.  An upper GI endoscopy on 7/24 showed gastritis with hemorrhage status postbiopsy.  Currently on clear liquid diet, advance per GI.  Colonoscopy done on 7/24 showed congested, erythematous, eroded, granular, ulcerated and vascular pattern decreased mucosa in the entire colon, suspicious for ulcerative colitis.  Active problems Bilateral lower extremity  swelling, intermittent joint pain-currently stable, no pain and no lower extremity swelling  Bilateral hand swelling-an arthritic component could potentially be with ulcerative colitis.  Her ANA was negative.  She was empirically started on prednisone with adequate response and her swelling is better today.  Acute blood loss anemia, iron deficiency anemia, B12 deficiency, microcytic anemia-anemia panel shows significant iron deficiency. B12 was quite low as well at 182.  Continue iron and B12 supplements  Skin rash-patient developed a rash on her forearms, back, after the blood transfusion which may represent a transfusion reaction.  Odd thing is that she has been having intermittent areas of similar rashes coming and going on her legs, abdomen, back.  This may also be related to her ulcerative colitis.  No new lesions today.  Continue to monitor.  Fibroids, heavy menses-likely etiology for her iron deficiency anemia.  Outpatient follow-up with OB/GYN  Obesity, morbid-BMI 49.6.  She would benefit from weight loss   Scheduled Meds:  cyanocobalamin  1,000 mcg Intramuscular Daily   ferrous gluconate  324 mg Oral TID WC   loratadine  10 mg Oral Daily   predniSONE  40 mg Oral Q breakfast   Continuous Infusions:   PRN Meds:.acetaminophen **OR** acetaminophen, diphenhydrAMINE, menthol-cetylpyridinium, mouth rinse, oxyCODONE  Diet Orders (From admission, onward)     Start     Ordered   06/22/22 1335  Diet clear liquid Room service appropriate? Yes; Fluid consistency: Thin  Diet effective now       Question Answer Comment  Room service appropriate? Yes   Fluid consistency: Thin  06/22/22 1334            DVT prophylaxis: SCDs Start: 06/20/22 2048   Lab Results  Component Value Date   PLT 321 06/23/2022      Code Status: Full Code  Status is: Inpatient Remains inpatient appropriate because: Persistent symptoms, new rash   Level of care: Med-Surg  Consultants:   GI   Objective: Vitals:   06/22/22 1452 06/22/22 1652 06/22/22 2256 06/23/22 0501  BP: (!) 148/87 (!) 160/88 (!) 147/80 140/80  Pulse: (!) 111 (!) 115 (!) 106 (!) 103  Resp: '18 18 20 14  '$ Temp: 98.3 F (36.8 C) 98.2 F (36.8 C) 98.8 F (37.1 C) 98.2 F (36.8 C)  TempSrc: Oral Oral Oral   SpO2: 99% 100% 98% 100%  Weight:      Height:        Intake/Output Summary (Last 24 hours) at 06/23/2022 1104 Last data filed at 06/22/2022 1533 Gross per 24 hour  Intake 218.82 ml  Output --  Net 218.82 ml    Wt Readings from Last 3 Encounters:  06/22/22 131.1 kg  01/10/21 (!) 141.1 kg  12/01/19 (!) 137.4 kg    Examination:  Constitutional: NAD Eyes: lids and conjunctivae normal, no scleral icterus ENMT: mmm Neck: normal, supple Respiratory: clear to auscultation bilaterally, no wheezing, no crackles. Normal respiratory effort.  Cardiovascular: Regular rate and rhythm, no murmurs / rubs / gallops. No LE edema. Abdomen: soft, no distention, no tenderness. Bowel sounds positive.  Skin: No new rashes Neurologic: no focal deficits, equal strength  Data Reviewed: I have independently reviewed following labs and imaging studies  CBC Recent Labs  Lab 06/19/22 2005 06/20/22 1234 06/21/22 0242 06/21/22 1039 06/22/22 0601 06/23/22 0452  WBC 11.3* 8.8 9.2  --  8.1 7.4  HGB 6.7* 6.1* 8.6* 8.7* 8.8* 8.4*  HCT 25.1* 23.0* 29.8* 30.5* 30.5* 29.7*  PLT 402* 365 356  --  350 321  MCV 62.3* 63.2* 68.8*  --  69.2* 69.6*  MCH 16.6* 16.8* 19.9*  --  20.0* 19.7*  MCHC 26.7* 26.5* 28.9*  --  28.9* 28.3*  RDW 21.9* 21.9* 25.7*  --  26.0* 26.7*  LYMPHSABS  --  0.9  --   --   --   --   MONOABS  --  0.7  --   --   --   --   EOSABS  --  0.4  --   --   --   --   BASOSABS  --  0.0  --   --   --   --      Recent Labs  Lab 06/19/22 2005 06/20/22 1234 06/20/22 1402 06/20/22 2034 06/21/22 0242 06/22/22 0601 06/23/22 0452  NA 138 136  --   --  139 135 138  K 3.4* 3.5  --   --  3.5 3.6  3.9  CL 108 109  --   --  112* 106 108  CO2 21* 21*  --   --  20* 20* 21*  GLUCOSE 88 103*  --   --  92 95 144*  BUN 7 10  --   --  7 7 <5*  CREATININE 0.63 0.76  --   --  0.61 0.76 0.63  CALCIUM 9.1 8.5*  --   --  8.7* 8.5* 9.2  AST  --  17  --   --  16  --   --   ALT  --  7  --   --  9  --   --   ALKPHOS  --  52  --   --  52  --   --   BILITOT  --  0.4  --   --  1.0  --   --   ALBUMIN  --  3.4*  --   --  3.3*  --   --   CRP 2.5*  --   --   --   --   --   --   INR  --   --  1.2  --   --   --   --   BNP  --   --   --  25.6  --   --   --      ------------------------------------------------------------------------------------------------------------------ No results for input(s): "CHOL", "HDL", "LDLCALC", "TRIG", "CHOLHDL", "LDLDIRECT" in the last 72 hours.  No results found for: "HGBA1C" ------------------------------------------------------------------------------------------------------------------ No results for input(s): "TSH", "T4TOTAL", "T3FREE", "THYROIDAB" in the last 72 hours.  Invalid input(s): "FREET3"  Cardiac Enzymes No results for input(s): "CKMB", "TROPONINI", "MYOGLOBIN" in the last 168 hours.  Invalid input(s): "CK" ------------------------------------------------------------------------------------------------------------------    Component Value Date/Time   BNP 25.6 06/20/2022 2034    CBG: No results for input(s): "GLUCAP" in the last 168 hours.  No results found for this or any previous visit (from the past 240 hour(s)).   Radiology Studies: No results found.   Marzetta Board, MD, PhD [facility]  Between 7 am - 7 pm I am available, please contact me via Amion (for emergencies) or Securechat (non urgent messages)  Between 7 pm - 7 am I am not available, please contact night coverage MD/APP via Amion

## 2022-06-23 NOTE — Consult Note (Signed)
   [facility] CM Inpatient Consult   06/23/2022  Dana Campbell 1981/08/01 [id number]   [employer] [ACO] Patient: [facility] Employee plan  *Remote review coverage and call - patient at [facility]  Primary Care Provider:  Precious Gilding, DO, with [facility] Medicine  Call attempts to patient without success     Plan: Will request for post hospital follow up by [place] Coordinator for community support and assist with disease managemtn.   For additional questions or referrals please contact:   Natividad Brood, RN BSN [facility] Liaison  [phone] business mobile phone Toll free office [phone]  Fax number: [phone] [email or web address]

## 2022-06-24 ENCOUNTER — Other Ambulatory Visit (HOSPITAL_COMMUNITY): Payer: Self-pay

## 2022-06-24 DIAGNOSIS — D500 Iron deficiency anemia secondary to blood loss (chronic): Secondary | ICD-10-CM

## 2022-06-24 DIAGNOSIS — D649 Anemia, unspecified: Secondary | ICD-10-CM

## 2022-06-24 DIAGNOSIS — K51218 Ulcerative (chronic) proctitis with other complication: Secondary | ICD-10-CM

## 2022-06-24 DIAGNOSIS — M7989 Other specified soft tissue disorders: Secondary | ICD-10-CM

## 2022-06-24 LAB — CBC
HCT: 29.2 % — ABNORMAL LOW (ref 36.0–46.0)
Hemoglobin: 8.2 g/dL — ABNORMAL LOW (ref 12.0–15.0)
MCH: 19.8 pg — ABNORMAL LOW (ref 26.0–34.0)
MCHC: 28.1 g/dL — ABNORMAL LOW (ref 30.0–36.0)
MCV: 70.5 fL — ABNORMAL LOW (ref 80.0–100.0)
Platelets: 304 10*3/uL (ref 150–400)
RBC: 4.14 MIL/uL (ref 3.87–5.11)
RDW: 27.4 % — ABNORMAL HIGH (ref 11.5–15.5)
WBC: 7.3 10*3/uL (ref 4.0–10.5)
nRBC: 0 % (ref 0.0–0.2)

## 2022-06-24 LAB — SURGICAL PATHOLOGY

## 2022-06-24 LAB — BASIC METABOLIC PANEL
Anion gap: 8 (ref 5–15)
BUN: 7 mg/dL (ref 6–20)
CO2: 26 mmol/L (ref 22–32)
Calcium: 9.4 mg/dL (ref 8.9–10.3)
Chloride: 106 mmol/L (ref 98–111)
Creatinine, Ser: 0.6 mg/dL (ref 0.44–1.00)
GFR, Estimated: >60 mL/min (ref >60)
Glucose, Bld: 90 mg/dL (ref 70–99)
Potassium: 4.1 mmol/L (ref 3.5–5.1)
Sodium: 140 mmol/L (ref 135–145)

## 2022-06-24 MED ORDER — CYANOCOBALAMIN 1000 MCG PO TABS
1000.0000 ug | ORAL_TABLET | Freq: Every day | ORAL | 0 refills | Status: AC
  Filled 2022-06-24: qty 30, 30d supply, fill #0

## 2022-06-24 MED ORDER — FERROUS GLUCONATE 324 (38 FE) MG PO TABS
324.0000 mg | ORAL_TABLET | Freq: Every day | ORAL | 0 refills | Status: DC
  Filled 2022-06-24: qty 30, 30d supply, fill #0

## 2022-06-24 MED ORDER — FERROUS GLUCONATE 324 (38 FE) MG PO TABS
324.0000 mg | ORAL_TABLET | Freq: Three times a day (TID) | ORAL | 0 refills | Status: DC
  Filled 2022-06-24: qty 90, 30d supply, fill #0

## 2022-06-24 MED ORDER — PREDNISONE 20 MG PO TABS
40.0000 mg | ORAL_TABLET | Freq: Every day | ORAL | 0 refills | Status: DC
  Filled 2022-06-24: qty 60, 30d supply, fill #0

## 2022-06-24 MED ORDER — MENTHOL 3 MG MT LOZG
1.0000 | LOZENGE | OROMUCOSAL | 12 refills | Status: DC | PRN
  Filled 2022-06-24: qty 100, fill #0

## 2022-06-24 NOTE — Discharge Summary (Signed)
Physician Discharge Summary   Patient: Dana Campbell MRN: [id number] DOB: [birth year]  Admit date:     06/20/2022  Discharge date: 06/24/22  Discharge Physician: Raiford Noble, DO   PCP: Precious Gilding, DO   Recommendations at discharge:   Follow-up with PCP within 1 to 2 weeks and repeat CBC, CMP, mag, Phos within 1 week Follow-up with GI in outpatient setting continue prednisone until seen by them  Discharge Diagnoses: Principal Problem:   GIB (gastrointestinal bleeding) Active Problems:   Symptomatic anemia   Iron deficiency anemia   Swelling of lower extremity   Swelling of right hand   Obesity, Class III, BMI 40-49.9 (morbid obesity) ([place])  Resolved Problems:   * No resolved hospital problems. [facility] Course: The patient is a 41 y.o. female with medical history significant of menorrhagia, fibroids. Presenting with abnormal labs. She reports that she had right hand swelling for a couple of days and BLE swelling for a couple of weeks. She has no previous history of HF. She decided to go to urgent care yesterday to get checked out. She was given toradol for her hand and sent home. Labs were drawn as part of her work up. When they resulted, she was notified that her Hgb level was low and it was recommended that she go to the ED for evaluation. The patient notes that she has had some BRBPR intermittently over the last couple of weeks. She did not have any dark stools.  GI consulted and patient underwent colonoscopy and upper endoscopy on 7/24.  She was found to have gastritis and also: Findings suspicious for ulcerative colitis.  She was started on prednisone and her joint pain and symptoms improved.  Hemoglobin remained stable but did drop slightly.  Diet was advanced and she tolerated soft so she was medically stable for discharge and GI recommending continuing prednisone until evaluated in the outpatient setting by them.  Assessment and Plan:  GI bleed, symptomatic anemia in the  setting of likely ulcerative colitis-GI consulted, appreciate input.  She was transfused 2 units of packed red blood cells with adequate improvement in her hemoglobin, and hemoglobin has remained stable.  An upper GI endoscopy on 7/24 showed gastritis with hemorrhage status postbiopsy.  Currently on clear liquid diet, advance per GI and this was advanced to soft and she tolerated this well.  Colonoscopy done on 7/24 showed congested, erythematous, eroded, granular, ulcerated and vascular pattern decreased mucosa in the entire colon, suspicious for ulcerative colitis. -GI recommending continuing prednisone 40 mg daily until seen in outpatient clinic   Bilateral lower extremity swelling, intermittent joint pain-currently stable, no pain and and now swelling is improved   Bilateral hand swelling-an arthritic component could potentially be with ulcerative colitis.  Her ANA was negative.  She was empirically started on prednisone with adequate response and her swelling is better today.  We will continue prednisone 40 mg daily until seen by GI in outpatient clinic   Acute blood loss anemia, iron deficiency anemia, B12 deficiency, microcytic anemia-anemia panel shows significant iron deficiency. B12 was quite low as well at 182.  Continue iron and B12 supplements -Continue to monitor for signs symptoms bleeding and hemoglobin was stable at discharge at 8.2/29.2 -Follow in outpatient setting within 1 week   Skin rash-patient developed a rash on her forearms, back, after the blood transfusion which may represent a transfusion reaction.  Odd thing is that she has been having intermittent areas of similar rashes coming and going on  her legs, abdomen, back.  This may also be related to her ulcerative colitis.  No new lesions today.  Continue to monitor and these lesions are improving -Follow-up with PCP and if necessary with dermatology   Fibroids, heavy menses-likely etiology for her iron deficiency anemia.   Outpatient follow-up with OB/GYN   Morbid Obesity -Complicates overall prognosis and care -Estimated body mass index is 49.61 kg/m as calculated from the following:   Height as of this encounter: '5\' 4"'$  (1.626 m).   Weight as of this encounter: 131.1 kg.  -Weight Loss and Dietary Counseling given  Consultants: Gastroenterology Procedures performed: Upper endoscopy and colonoscopy Disposition: Home Diet recommendation:  Discharge Diet Orders (From admission, onward)     Start     Ordered   06/24/22 0000  Diet - low sodium heart healthy        06/24/22 1136           Regular diet  DISCHARGE MEDICATION: Allergies as of 06/24/2022       Reactions   Cinnamon Itching        Medication List     STOP taking these medications    benzonatate 100 MG capsule Commonly known as: TESSALON   doxycycline 100 MG capsule Commonly known as: VIBRAMYCIN   hydrocortisone 25 MG suppository Commonly known as: ANUSOL-HC   lactobacillus acidophilus & bulgar chewable tablet   Olopatadine HCl 0.2 % Soln   ondansetron 4 MG disintegrating tablet Commonly known as: ZOFRAN-ODT   ranitidine 150 MG capsule Commonly known as: ZANTAC   triamcinolone ointment 0.5 % Commonly known as: KENALOG       TAKE these medications    acetaminophen 500 MG tablet Commonly known as: TYLENOL Take 1,000 mg by mouth every 8 (eight) hours as needed (pain).   cetirizine 10 MG tablet Commonly known as: ZYRTEC Take 1 tablet (10 mg total) by mouth daily.   cyanocobalamin 1000 MCG tablet Take 1 tablet (1,000 mcg total) by mouth daily.   ferrous gluconate 324 MG tablet Commonly known as: FERGON Take 1 tablet (324 mg total) by mouth daily with breakfast.   ibuprofen 200 MG tablet Commonly known as: ADVIL Take 600 mg by mouth as needed (pain). What changed: Another medication with the same name was removed. Continue taking this medication, and follow the directions you see here.    menthol-cetylpyridinium 3 MG lozenge Commonly known as: CEPACOL Take 1 lozenge (3 mg total) by mouth as needed for sore throat.   predniSONE 20 MG tablet Commonly known as: DELTASONE Take 2 tablets (40 mg total) by mouth daily with breakfast. Start taking on: June 25, 2022   TUMS PO Take 2 tablets by mouth as needed (heartburn).       Discharge Exam: Filed Weights   06/20/22 1229 06/22/22 1013  Weight: 131.1 kg 131.1 kg   Vitals:   06/23/22 2053 06/24/22 0603  BP: 140/75 134/65  Pulse: 98 96  Resp: 19 18  Temp: 97.7 F (36.5 C) 98.2 F (36.8 C)  SpO2: 100% 100%   Examination: Physical Exam:  Constitutional: WN/WD morbidly obese AAF in no acute distress appears calm Respiratory: Diminished to auscultation bilaterally, no wheezing, rales, rhonchi or crackles. Normal respiratory effort and patient is not tachypenic. No accessory muscle use.  Cardiovascular: RRR, no murmurs / rubs / gallops. S1 and S2 auscultated. No extremity edema. Abdomen: Soft, non-tender, distended secondary body habitus. Bowel sounds positive.  GU: Deferred. Musculoskeletal: No clubbing / cyanosis of digits/nails. No joint  deformity upper and lower extremities.  Swelling is improving Skin: Skin rash is improved Neurologic: CN 2-12 grossly intact with no focal deficits. Romberg sign cerebellar and reflexes not assessed.  Psychiatric: Normal judgment and insight. Alert and oriented x 3. Normal mood and appropriate affect.   Condition at discharge: stable  The results of significant diagnostics from this hospitalization (including imaging, microbiology, ancillary and laboratory) are listed below for reference.   Imaging Studies: DG Hand Complete Right  Result Date: 06/20/2022 CLINICAL DATA:  Pt reported right posterior hand swelling for a few days with pain. Pt denied any injury. EXAM: RIGHT HAND - COMPLETE 3+ VIEW COMPARISON:  None Available. FINDINGS: No fracture or bone lesion. Joints are  normally spaced and aligned.  No arthropathic changes. Dorsal soft tissue swelling. No soft tissue air. No radiopaque foreign body. IMPRESSION: 1. No fracture, bone lesion or joint abnormality. 2. Dorsal soft tissue swelling. Electronically Signed   By: Lajean Manes M.D.   On: 06/20/2022 18:38    Microbiology: Results for orders placed or performed during the hospital encounter of 10/24/21  Gastrointestinal Panel by PCR , Stool     Status: None   Collection Time: 10/24/21 11:18 AM   Specimen: STOOL  Result Value Ref Range Status   Campylobacter species NOT DETECTED NOT DETECTED Final   Plesimonas shigelloides NOT DETECTED NOT DETECTED Final   Salmonella species NOT DETECTED NOT DETECTED Final   Yersinia enterocolitica NOT DETECTED NOT DETECTED Final   Vibrio species NOT DETECTED NOT DETECTED Final   Vibrio cholerae NOT DETECTED NOT DETECTED Final   Enteroaggregative E coli (EAEC) NOT DETECTED NOT DETECTED Final   Enteropathogenic E coli (EPEC) NOT DETECTED NOT DETECTED Final   Enterotoxigenic E coli (ETEC) NOT DETECTED NOT DETECTED Final   Shiga like toxin producing E coli (STEC) NOT DETECTED NOT DETECTED Final   Shigella/Enteroinvasive E coli (EIEC) NOT DETECTED NOT DETECTED Final   Cryptosporidium NOT DETECTED NOT DETECTED Final   Cyclospora cayetanensis NOT DETECTED NOT DETECTED Final   Entamoeba histolytica NOT DETECTED NOT DETECTED Final   Giardia lamblia NOT DETECTED NOT DETECTED Final   Adenovirus F40/41 NOT DETECTED NOT DETECTED Final   Astrovirus NOT DETECTED NOT DETECTED Final   Norovirus GI/GII NOT DETECTED NOT DETECTED Final   Rotavirus A NOT DETECTED NOT DETECTED Final   Sapovirus (I, II, IV, and V) NOT DETECTED NOT DETECTED Final    Comment: Performed at [facility], [address] [place], [place] [address]  C Difficile Quick Screen w PCR reflex     Status: None   Collection Time: 10/24/21 11:18 AM   Specimen: STOOL  Result Value Ref Range Status   C Diff  antigen NEGATIVE NEGATIVE Final   C Diff toxin NEGATIVE NEGATIVE Final   C Diff interpretation No C. difficile detected.  Final    Comment: Performed at [facility], [place] [address] [place], [place] [address]   Labs: CBC: Recent Labs  Lab 06/20/22 1234 06/21/22 0242 06/21/22 1039 06/22/22 0601 06/23/22 0452 06/24/22 0530  WBC 8.8 9.2  --  8.1 7.4 7.3  NEUTROABS 6.8  --   --   --   --   --   HGB 6.1* 8.6* 8.7* 8.8* 8.4* 8.2*  HCT 23.0* 29.8* 30.5* 30.5* 29.7* 29.2*  MCV 63.2* 68.8*  --  69.2* 69.6* 70.5*  PLT 365 356  --  350 321 562   Basic Metabolic Panel: Recent Labs  Lab 06/20/22 1234 06/21/22  0165 06/22/22 0601 06/23/22 0452 06/24/22 0530  NA 136 139 135 138 140  K 3.5 3.5 3.6 3.9 4.1  CL 109 112* 106 108 106  CO2 21* 20* 20* 21* 26  GLUCOSE 103* 92 95 144* 90  BUN '10 7 7 '$ <5* 7  CREATININE 0.76 0.61 0.76 0.63 0.60  CALCIUM 8.5* 8.7* 8.5* 9.2 9.4   Liver Function Tests: Recent Labs  Lab 06/20/22 1234 06/21/22 0242  AST 17 16  ALT 7 9  ALKPHOS 52 52  BILITOT 0.4 1.0  PROT 8.1 8.1  ALBUMIN 3.4* 3.3*   CBG: No results for input(s): "GLUCAP" in the last 168 hours.  Discharge time spent: greater than 30 minutes.  Signed: Raiford Noble, DO [facility] 06/24/2022

## 2022-06-24 NOTE — Progress Notes (Signed)
Order to discharge pt home.  Discharge instructions/AVS given to patient and reviewed - education provided as needed.  Pt advised to call PCP and/or come back to the hospital if there are any problems. Pt verbalized understanding.     Pt will need to tolerate soft diet and then able to d/c home.

## 2022-06-24 NOTE — Anesthesia Postprocedure Evaluation (Signed)
Anesthesia Post Note  Patient: Dana Campbell  Procedure(s) Performed: ESOPHAGOGASTRODUODENOSCOPY (EGD) WITH PROPOFOL BIOPSY COLONOSCOPY     Patient location during evaluation: Endoscopy Anesthesia Type: MAC Level of consciousness: awake and alert Pain management: pain level controlled Vital Signs Assessment: post-procedure vital signs reviewed and stable Respiratory status: spontaneous breathing, nonlabored ventilation and respiratory function stable Cardiovascular status: stable and blood pressure returned to baseline Postop Assessment: no apparent nausea or vomiting Anesthetic complications: no   No notable events documented.  Last Vitals:  Vitals:   06/23/22 2053 06/24/22 0603  BP: 140/75 134/65  Pulse: 98 96  Resp: 19 18  Temp: 36.5 C 36.8 C  SpO2: 100% 100%    Last Pain:  Vitals:   06/24/22 0907  TempSrc:   PainSc: 3                  Rhen Kawecki

## 2022-06-24 NOTE — Plan of Care (Signed)

## 2022-06-29 ENCOUNTER — Encounter: Payer: Self-pay | Admitting: *Deleted

## 2022-06-29 ENCOUNTER — Other Ambulatory Visit: Payer: Self-pay | Admitting: *Deleted

## 2022-06-29 NOTE — Patient Instructions (Signed)
Visit Information  Thank you for taking time to visit with me today. Please don't hesitate to contact me if I can be of assistance to you.   Following are the goals we discussed today:   Goals Addressed               This Visit's Progress     Patient Stated     Make and Keep All Appointments (pt-stated)   On track     Timeframe:  Short-Term Goal Priority:  High Start Date:                06/29/22             Expected End Date:     07/30/22                  Follow Up Date 07/06/22    Awaiting a call from her GI provider or she will try to call around 1200 today if no call Will call RN CM if assist needed to get a hospital/EGD follow up appointment  Care Coordination Interventions: Assessed social determinant of health barriers  Reviewed her recent discharge instructions. Discussed importance of pcp and specialist follow up appointments.  Encouraged to return a call to RN CM if unable to get a GI follow up appointment. Reminded her to request a hospital follow up appointment to assist with getting a timely follow up appointment. Left RN CM office number for any further needs Discussed RN CM future follow up outreach   Notes:         Our next appointment is by telephone on 07/06/22 at 11 am  Please call the care guide team at [phone] if you need to cancel or reschedule your appointment.   If you are experiencing a Mental Health or [place] or need someone to talk to, please call the Suicide and Crisis Lifeline: 988 call the [place] National Suicide Prevention Lifeline: 743 522 4257 or TTY: 2502546458 TTY (812) 740-7344) to talk to a trained counselor call 1-800-273-TALK (toll free, 24 hour hotline) go to [facility] [address] [place] [phone]) call 911   Patient verbalizes understanding of instructions and care plan provided today and agrees to view in [place]. Active MyChart status and patient understanding of  how to access instructions and care plan via MyChart confirmed with patient.     Telephone follow up appointment with care management team member scheduled for: The patient has been provided with contact information for the care management team and has been advised to call with any health related questions or concerns.   Fabrizzio Marcella L. Lavina Hamman, RN, BSN, [place] Coordinator Office number [phone] Main [facility] number [phone] Fax number [phone]

## 2022-06-29 NOTE — Patient Outreach (Signed)
  Care Coordination [facility] Note Transition Care Management Follow-up Telephone Call Date of discharge and from where: 06/24/22 from   How have you been since you were released from the hospital? good Any questions or concerns? Yes Awaiting a call from her GI provider or she will try to call around 1200 today if no call Will call RN CM if assist needed to get a hospital/EGD follow up appointment This follow up appointment was not indicated on her discharge instructions for 06/24/22 and she was noted with an EMMI red alert for scheduled follow up? No per referral to RN CM on 06/29/22. Also sent to RN CM on 06/24/22 pm for post hospital UMR services Items Reviewed: Did the pt receive and understand the discharge instructions provided? Yes  Medications obtained and verified? Yes  Other? No  Any new allergies since your discharge? No  Dietary orders reviewed? No Do you have support at home? Yes  parents, significant other  Home Care and Equipment/Supplies: Were home health services ordered? not applicable If so, what is the name of the agency? na  Has the agency set up a time to come to the patient's home? not applicable Were any new equipment or medical supplies ordered?  No What is the name of the medical supply agency? na Were you able to get the supplies/equipment? not applicable Do you have any questions related to the use of the equipment or supplies? No  Functional Questionnaire: (I = Independent and D = Dependent) ADLs: I  Bathing/Dressing- I  Meal Prep- I  Eating- I  Maintaining continence- I  Transferring/Ambulation- I  Managing Meds- I  Follow up appointments reviewed:  PCP Hospital f/u appt confirmed? Yes  Scheduled to see Dr Precious Gilding on 07/14/22 @ 11 am. [facility] f/u appt confirmed? No  Scheduled to see GI pending Are transportation arrangements needed? Yes  If their condition worsens, is the pt aware to call PCP or go to the Emergency Dept.?  Yes Was the patient provided with contact information for the PCP's office or ED? Yes Was to pt encouraged to call back with questions or concerns? Yes  SDOH assessments and interventions completed:   Yes  Care Coordination Interventions Activated:  Yes Care Coordination Interventions:   Reviewed her recent discharge instructions. Discussed importance of pcp and specialist follow up appointments.  Encouraged to return a call to RN CM if unable to get a GI follow up appointment. Reminded her to request a hospital follow up appointment to assist with getting a timely follow up appointment. Left RN CM office number for any further needs Discussed RN CM future follow up outreach  Encounter Outcome:  Pt. Visit Completed Follow up appointment scheduled with patient   Joelene Millin L. Lavina Hamman, RN, BSN, [place] Coordinator Office number [phone] Main [facility] number [phone] Fax number [phone]

## 2022-07-06 ENCOUNTER — Encounter: Payer: Self-pay | Admitting: *Deleted

## 2022-07-06 ENCOUNTER — Ambulatory Visit: Payer: Self-pay | Admitting: *Deleted

## 2022-07-06 NOTE — Patient Instructions (Signed)
Visit Information  Thank you for taking time to visit with me today. Please don't hesitate to contact me if I can be of assistance to you.   Please follow up on noted pending care gaps with your providers related to the covid vaccine records, hepatitis c vaccine records, tetanus/TDAP vaccine records and pap smear results  Documentation for these items are noted missing on your chart. If they were completed, the provider's office may need to scan them in for you.    Following are the goals we discussed today:   Goals Addressed               This Visit's Progress     Patient Stated     COMPLETED: Make and Keep All Appointments (pt-stated)   On track     Timeframe:  Short-Term Goal Priority:  High Start Date:                06/29/22             Expected End Date:     07/30/22                  Closure 07/06/22    Awaiting a call from her GI provider or she will try to call around 1200 today if no call Will call RN CM if assist needed to get a hospital/EGD follow up appointment  Care Coordination Interventions: Assessed social determinant of health barriers  Reviewed her recent discharge instructions. Discussed importance of pcp and specialist follow up appointments.  Encouraged to return a call to RN CM if unable to get a GI follow up appointment. Reminded her to request a hospital follow up appointment to assist with getting a timely follow up appointment. Left RN CM office number for any further needs Discussed RN CM future follow up outreach   Notes:  07/06/22 No concerns today, still pending follow up appointment but understands the office will outreach to her pending the return of her biopsy results  Encouraged patient in her AVS to follow up on her noted care gaps with her pcp for- covid, hepatitis c, tetanus/TDAP, pap            Please call the care guide team at [phone] if you need to cancel or reschedule your appointment.   If you are experiencing a Mental Health or  [place] or need someone to talk to, please call the Suicide and Crisis Lifeline: 988 call the [place] National Suicide Prevention Lifeline: (228)058-8817 or TTY: (206)096-4497 TTY (458)355-8618) to talk to a trained counselor call 1-800-273-TALK (toll free, 24 hour hotline) go to [facility] [address] [place] [phone]) call 911   Patient verbalizes understanding of instructions and care plan provided today and agrees to view in [place]. Active MyChart status and patient understanding of how to access instructions and care plan via MyChart confirmed with patient.     No further follow up required: no further identified needs  Malori Myers L. Lavina Hamman, RN, BSN, [place] Coordinator Office number [phone]

## 2022-07-06 NOTE — Patient Outreach (Signed)
  Care Coordination   Follow Up Visit Note   07/06/2022 Name: Dana Campbell MRN: [id number] DOB: [birth year]  Dana Campbell is a 41 y.o. year old female who sees Precious Gilding, DO for primary care. I spoke with  Dana Campbell by phone today  What matters to the patients health and wellness today?  No concerns today, still pending follow up appointment but understands the office will outreach to her pending the return of her biopsy results  Case closure no identified needs Pt is appreciative of outreach and follow up  She confirms she will outreach prn    Goals Addressed               This Visit's Progress     Patient Stated     COMPLETED: Make and Keep All Appointments (pt-stated)   On track     Timeframe:  Short-Term Goal Priority:  High Start Date:                06/29/22             Expected End Date:     07/30/22                  Closure 07/06/22    Awaiting a call from her GI provider or she will try to call around 1200 today if no call Will call RN CM if assist needed to get a hospital/EGD follow up appointment  Care Coordination Interventions: Assessed social determinant of health barriers  Reviewed her recent discharge instructions. Discussed importance of pcp and specialist follow up appointments.  Encouraged to return a call to RN CM if unable to get a GI follow up appointment. Reminded her to request a hospital follow up appointment to assist with getting a timely follow up appointment. Left RN CM office number for any further needs Discussed RN CM future follow up outreach   Notes:  07/06/22 No concerns today, still pending follow up appointment but understands the office will outreach to her pending the return of her biopsy results  Encouraged patient in her AVS to follow up on her noted care gaps with her pcp for- covid, hepatitis c, tetanus/TDAP, pap          SDOH assessments and interventions completed:  No     Care Coordination Interventions Activated:   Yes  Care Coordination Interventions:  Yes, provided  Encouraged patient in her AVS to follow up on her noted care gaps with her pcp for- covid, hepatitis c, tetanus/TDAP, pap    Follow up plan: No further intervention required.   Encounter Outcome:  Pt. Visit Completed   Deauna Yaw L. Lavina Hamman, RN, BSN, [place] Coordinator Office number [phone]

## 2022-07-07 ENCOUNTER — Telehealth: Payer: Self-pay

## 2022-07-07 NOTE — Telephone Encounter (Signed)
Patient calls nurse line regarding concerns for possible medication reaction.   Patient reports that she started breaking out in hives on face, back, neck, chest and arms. Reports that areas are itchy, red and raised. Onset yesterday afternoon. Took benadryl at 1200 today.   Patient is concerned she is having a delayed reaction to prednisone. She has been on this for the last two weeks. No new detergents, body washes or dietary changes.   Denies difficulty breathing, chest tightness or issues with swallowing.   Patient is asking if she should continue prednisone. She has an upcoming appointment with PCP on 07/14/22.   ED precautions given. Please advise next steps.   Talbot Grumbling, RN

## 2022-07-08 ENCOUNTER — Other Ambulatory Visit: Payer: Self-pay

## 2022-07-08 ENCOUNTER — Other Ambulatory Visit (HOSPITAL_COMMUNITY): Payer: Self-pay

## 2022-07-08 ENCOUNTER — Emergency Department (HOSPITAL_COMMUNITY)
Admission: EM | Admit: 2022-07-08 | Discharge: 2022-07-08 | Disposition: A | Discharge status: Home / Self Care | Payer: 59 | Attending: Emergency Medicine | Admitting: Emergency Medicine

## 2022-07-08 ENCOUNTER — Encounter (HOSPITAL_COMMUNITY): Payer: Self-pay

## 2022-07-08 DIAGNOSIS — R000 Tachycardia, unspecified: Secondary | ICD-10-CM | POA: Insufficient documentation

## 2022-07-08 DIAGNOSIS — Z79899 Other long term (current) drug therapy: Secondary | ICD-10-CM | POA: Insufficient documentation

## 2022-07-08 DIAGNOSIS — R21 Rash and other nonspecific skin eruption: Secondary | ICD-10-CM

## 2022-07-08 DIAGNOSIS — L509 Urticaria, unspecified: Secondary | ICD-10-CM | POA: Insufficient documentation

## 2022-07-08 LAB — URINALYSIS, ROUTINE W REFLEX MICROSCOPIC
Bacteria, UA: NONE SEEN
Bilirubin Urine: NEGATIVE
Glucose, UA: NEGATIVE mg/dL
Ketones, ur: NEGATIVE mg/dL
Leukocytes,Ua: NEGATIVE
Nitrite: NEGATIVE
Protein, ur: 30 mg/dL — AB
Specific Gravity, Urine: 1.018 (ref 1.005–1.030)
pH: 5 (ref 5.0–8.0)

## 2022-07-08 LAB — CBC WITH DIFFERENTIAL/PLATELET
Abs Immature Granulocytes: 0.04 10*3/uL (ref 0.00–0.07)
Basophils Absolute: 0 10*3/uL (ref 0.0–0.1)
Basophils Relative: 0 %
Eosinophils Absolute: 0.1 10*3/uL (ref 0.0–0.5)
Eosinophils Relative: 1 %
HCT: 33.3 % — ABNORMAL LOW (ref 36.0–46.0)
Hemoglobin: 9.5 g/dL — ABNORMAL LOW (ref 12.0–15.0)
Immature Granulocytes: 0 %
Lymphocytes Relative: 14 %
Lymphs Abs: 1.6 10*3/uL (ref 0.7–4.0)
MCH: 21 pg — ABNORMAL LOW (ref 26.0–34.0)
MCHC: 28.5 g/dL — ABNORMAL LOW (ref 30.0–36.0)
MCV: 73.7 fL — ABNORMAL LOW (ref 80.0–100.0)
Monocytes Absolute: 0.6 10*3/uL (ref 0.1–1.0)
Monocytes Relative: 5 %
Neutro Abs: 8.8 10*3/uL — ABNORMAL HIGH (ref 1.7–7.7)
Neutrophils Relative %: 80 %
Platelets: 336 10*3/uL (ref 150–400)
RBC: 4.52 MIL/uL (ref 3.87–5.11)
RDW: 30.3 % — ABNORMAL HIGH (ref 11.5–15.5)
WBC: 11.1 10*3/uL — ABNORMAL HIGH (ref 4.0–10.5)
nRBC: 0 % (ref 0.0–0.2)

## 2022-07-08 LAB — COMPREHENSIVE METABOLIC PANEL
ALT: 9 U/L (ref 0–44)
AST: 18 U/L (ref 15–41)
Albumin: 3.5 g/dL (ref 3.5–5.0)
Alkaline Phosphatase: 49 U/L (ref 38–126)
Anion gap: 8 (ref 5–15)
BUN: 11 mg/dL (ref 6–20)
CO2: 21 mmol/L — ABNORMAL LOW (ref 22–32)
Calcium: 8.5 mg/dL — ABNORMAL LOW (ref 8.9–10.3)
Chloride: 107 mmol/L (ref 98–111)
Creatinine, Ser: 0.85 mg/dL (ref 0.44–1.00)
GFR, Estimated: >60 mL/min (ref >60)
Glucose, Bld: 100 mg/dL — ABNORMAL HIGH (ref 70–99)
Potassium: 3.8 mmol/L (ref 3.5–5.1)
Sodium: 136 mmol/L (ref 135–145)
Total Bilirubin: 1 mg/dL (ref 0.3–1.2)
Total Protein: 7 g/dL (ref 6.5–8.1)

## 2022-07-08 LAB — TYPE AND SCREEN
ABO/RH(D): O POS
Antibody Screen: NEGATIVE

## 2022-07-08 LAB — PREGNANCY, URINE: Preg Test, Ur: NEGATIVE

## 2022-07-08 MED ORDER — METHYLPREDNISOLONE SODIUM SUCC 125 MG IJ SOLR
125.0000 mg | Freq: Once | INTRAMUSCULAR | Status: AC
  Administered 2022-07-08: 125 mg via INTRAVENOUS
  Filled 2022-07-08: qty 2

## 2022-07-08 MED ORDER — LACTATED RINGERS IV BOLUS
1000.0000 mL | Freq: Once | INTRAVENOUS | Status: AC
  Administered 2022-07-08: 1000 mL via INTRAVENOUS

## 2022-07-08 MED ORDER — PREDNISONE 20 MG PO TABS
40.0000 mg | ORAL_TABLET | Freq: Every day | ORAL | 0 refills | Status: DC
  Filled 2022-07-08 – 2022-07-17 (×2): qty 10, 5d supply, fill #0

## 2022-07-08 NOTE — ED Triage Notes (Signed)
Pt c/o rash to face, neck, chest and extremities for the past three days. Pt denies any respiratory symptoms. NAD. She is tachycardic during triage. Recently admitted and had to have a blood transfusion.

## 2022-07-08 NOTE — Discharge Instructions (Addendum)
You were seen in the ER for evaluation of your rash and hives. Unsure if this is related to your ulcerative colitis. Continue taking your medications as prescribed. Remember to follow up with [facility] on August 18th '@3'$ :00PM for follow up. Remember to take Zyrtec daily as well. Keep well moisturized and discontinue use of your Olay soap. If you rash worsens, blisters, peels, spreads, or becomes painful, I want for you to return to the ER immediately. If you have any fever, joint swelling, please return to the ER. If you have any concern, new or worsening symptoms, present to the nearest ER for re-evaluation.   Contact a health care provider if: You sweat at night. You lose weight. You urinate more than normal. You urinate less than normal, or you notice that your urine is a darker color than usual. You feel weak. You vomit. Your skin or the whites of your eyes look yellow (jaundice). Your skin: Tingles. Is numb. Your rash: Does not go away after several days. Gets worse. You are: Unusually thirsty. More tired than normal. You have: New symptoms. Pain in your abdomen. A fever. Diarrhea. Get help right away if you: Have a fever and your symptoms suddenly get worse. Develop confusion. Have a severe headache or a stiff neck. Have severe joint pains or stiffness. Have a seizure. Develop a rash that covers all or most of your body. The rash may or may not be painful. Develop blisters that: Are on top of the rash. Grow larger or grow together. Are painful. Are inside your nose or mouth. Develop a rash that: Looks like purple pinprick-sized spots all over your body. Has a "bull's eye" or looks like a target. Is not related to sun exposure, is red and painful, and causes your skin to peel.

## 2022-07-08 NOTE — ED Notes (Signed)
Labs redrawn and sent to lab.

## 2022-07-08 NOTE — ED Provider Notes (Signed)
[facility] DEPT Provider Note   CSN: [id number] Arrival date & time: 07/08/22  1202     History Chief Complaint  Patient presents with   Rash   Tachycardia    Dana Campbell is a 41 y.o. female history of acute GI bleed, questionable ulcerative colitis, atopic dermatitis presents to the emergency department for evaluation of rash and urticaria.  Patient reports that she has been getting this strange red/purple rash that has been appearing on her body that will slowly fade.  She reports that 2 days ago, she developed a large rash in between her legs on her thighs.  Additionally, 2 days ago she reports that she developed some urticaria on her face, chest, and neck.  Denies any trouble swallowing, chest pain, or any shortness of breath.  Patient reports that she has been stressed after her recent admission to the hospital.  She was placed on Protonix, B12, iron, and prednisone for her symptoms.  She reports that they thought the rash and hand swelling that she had previously had some rheumatologic source and ordered "some labs".  She reports that she has been try to get in touch with [facility] to schedule appointment however has not been successful.  She has an appointment with her PCP in the upcoming week.  She has not seen a dermatologist.  She reports that she has been using a new away soap as well.  Denies any new foods or detergents.  Patient has been on her current medicine regimen for the past 2 weeks.  She denies any dark urine, dysuria, hematuria, sore throat, or fever.  She reports that she has had 2 bloody bowel movements in the past 2 weeks otherwise has had normal bowel movements.  Denies any melena.  Denies any abdominal pain, nausea, or vomiting.   Rash Associated symptoms: no abdominal pain, no diarrhea, no fever, no nausea, no shortness of breath and not vomiting        Home Medications Prior to Admission medications   Medication Sig Start Date  End Date Taking? Authorizing Provider  acetaminophen (TYLENOL) 500 MG tablet Take 1,000 mg by mouth every 8 (eight) hours as needed (pain).    [provider]  Calcium Carbonate Antacid (TUMS PO) Take 2 tablets by mouth as needed (heartburn).    [provider]  cetirizine (ZYRTEC) 10 MG tablet Take 1 tablet (10 mg total) by mouth daily. 03/25/20   Loura Halt A, NP  cyanocobalamin 1000 MCG tablet Take 1 tablet (1,000 mcg total) by mouth daily. 06/24/22 07/24/22  Raiford Noble Latif, DO  ferrous gluconate (FERGON) 324 MG tablet Take 1 tablet (324 mg total) by mouth daily with breakfast. 06/24/22   Raiford Noble Latif, DO  ibuprofen (ADVIL) 200 MG tablet Take 600 mg by mouth as needed (pain).    [provider]  menthol-cetylpyridinium (CEPACOL) 3 MG lozenge Take 1 lozenge (3 mg total) by mouth as needed for sore throat. 06/24/22   Raiford Noble Latif, DO  predniSONE (DELTASONE) 20 MG tablet Take 2 tablets (40 mg total) by mouth daily with breakfast. 06/25/22 07/25/22  Raiford Noble Latif, DO      Allergies    Cinnamon    Review of Systems   Review of Systems  Constitutional:  Negative for chills and fever.  HENT:  Negative for congestion and rhinorrhea.   Respiratory:  Negative for shortness of breath.   Cardiovascular:  Negative for chest pain.  Gastrointestinal:  Positive for  blood in stool. Negative for abdominal pain, constipation, diarrhea, nausea and vomiting.  Genitourinary:  Negative for dysuria and hematuria.  Musculoskeletal:  Negative for back pain.  Skin:  Positive for rash.    Physical Exam Updated Vital Signs BP (!) 150/90   Pulse (!) 104   Temp 99 F (37.2 C) (Oral)   Resp 14   Ht '5\' 4"'$  (1.626 m)   Wt 128.4 kg   LMP 06/22/2022 (Exact Date)   SpO2 97%   BMI 48.58 kg/m  Physical Exam Vitals and nursing note reviewed.  Constitutional:      Appearance: She is not toxic-appearing.     Comments: Anxious appearing  HENT:     Head: Normocephalic  and atraumatic.     Comments: Urticaria noted to the patient's bilateral eye lides, forehead, and cheeks. Some noted in her neck as well.     Mouth/Throat:     Mouth: Mucous membranes are moist.     Comments: No mucosal involvement.  Eyes:     General: No scleral icterus. Cardiovascular:     Rate and Rhythm: Regular rhythm. Tachycardia present.  Pulmonary:     Effort: Pulmonary effort is normal.     Breath sounds: Normal breath sounds.  Abdominal:     General: Bowel sounds are normal.     Palpations: Abdomen is soft.     Tenderness: There is no abdominal tenderness. There is no guarding or rebound.     Comments: Abdominal exam limited secondary to body habitus. Patient has more of these purple and red rash spots. Not nodular or tender upon palpation.  NO abdominal tenderness, guarding, rebound noted.  No peritoneal signs.  Musculoskeletal:        General: No deformity.     Cervical back: Normal range of motion.  Skin:    General: Skin is warm and dry.     Comments: Patient has 3 distinct different types of skin eruptions to her body.  To her face, chest, and neck as well as some of her back, she has diffuse urticaria.  Additionally to her upper thighs bilaterally she has more of a red raised telangiectasia appearing rash.  Lastly, she has a deep red-purple rash to her inner thighs as well as to her hips.  Nothing seen on her hands or feet.  No skin sloughing noted.  No blisters.  No ulcerations or wounds.  Neurological:     General: No focal deficit present.     Mental Status: She is alert. Mental status is at baseline.    **MORE pictures are located in the Media of her chart.                 ED Results / Procedures / Treatments   Labs (all labs ordered are listed, but only abnormal results are displayed) Labs Reviewed  COMPREHENSIVE METABOLIC PANEL - Abnormal; Notable for the following components:      Result Value   CO2 21 (*)    Glucose, Bld 100 (*)    Calcium  8.5 (*)    All other components within normal limits  URINALYSIS, ROUTINE W REFLEX MICROSCOPIC - Abnormal; Notable for the following components:   APPearance HAZY (*)    Hgb urine dipstick SMALL (*)    Protein, ur 30 (*)    All other components within normal limits  CBC WITH DIFFERENTIAL/PLATELET - Abnormal; Notable for the following components:   WBC 11.1 (*)    Hemoglobin 9.5 (*)  HCT 33.3 (*)    MCV 73.7 (*)    MCH 21.0 (*)    MCHC 28.5 (*)    RDW 30.3 (*)    All other components within normal limits  PREGNANCY, URINE  CBC WITH DIFFERENTIAL/PLATELET  TYPE AND SCREEN    EKG EKG Interpretation  Date/Time:  Wednesday July 08 2022 12:17:43 EDT Ventricular Rate:  124 PR Interval:  124 QRS Duration: 66 QT Interval:  296 QTC Calculation: 425 R Axis:   -18 Text Interpretation: Sinus tachycardia Right atrial enlargement When compared with ECG of 20-Jun-2022 12:38, PREVIOUS ECG IS PRESENT No significant change since last tracing Confirmed by Pattricia Boss [phone]) on 07/08/2022 3:51:38 PM  Radiology No results found.  Procedures Procedures  Medications Ordered in ED Medications  lactated ringers bolus 1,000 mL (0 mLs Intravenous Stopped 07/08/22 1611)  methylPREDNISolone sodium succinate (SOLU-MEDROL) 125 mg/2 mL injection 125 mg (125 mg Intravenous Given 07/08/22 1450)    ED Course/ Medical Decision Making/ A&P                           Medical Decision Making Amount and/or Complexity of Data Reviewed Labs: ordered.  Risk Prescription drug management.   41 year old female presents the emergency department for evaluation of rash to her body.  Differential diagnosis includes but is not limited to SJS, TENS, erythema nodosum, urticaria, medication reaction, stress-induced urticaria, atopic dermatitis, vasculitis, pyoderma gangrenosum, rheumatological etiology.  Vital signs show slightly elevated blood pressure and mild tachycardia, afebrile, satting well room air with any  increased work of breathing.  Physical exam as listed above.  Please see images for additional information.  Patient reports that she was having these rashes before her admission into the hospital in the middle of July.  She reports that the rashes would show up and then fade over time.  The urticaria however just showed up a few days ago.  She reports that she has a history in childhood and into her teenage years of urticaria from changing different soaps or laundry detergents because she has sensitive skin.  She has been on her medications for the past 2 weeks.  She denies any trouble breathing, shortness of breath, or chest pain.  I independently reviewed and interpreted the patient's labs.  CBC shows slight leukocytosis at 11.1 with a left shift.  Her hemoglobin is still low however has greatly improved and is at 9.5.  Platelets are normal.  Pregnancy test negative.  Urinalysis shows a small amount of hemoglobin with 30 protein otherwise normal.  CMP shows glucose at 100 and slight decrease in bicarb at 21.  Slight decrease in calcium at 8.5 otherwise no electrolyte or LFT abnormalities.  EKG reviewed and interpreted by my attending and is unchanged from her previous.  Sinus tachycardia present.  Unsure of what is causing these rashes, however I do not think that it is drug-induced given that she was having these before she was put on her new medication.  The rashes to her legs are not tender, there is no blisters, but they are not itchy as well.  My attending assessed at bedside and recommended some Solu-Medrol.  I will also give the patient some fluids given her tachycardia although I think it is mildly given her anxiety over the situation.  On reevaluation after the Solu-Medrol, the patient's hives on her face have greatly improved.  She reports that she is feeling better.  Tachycardia has improved and she was  at 24 while I was in the room.  Attending reports she is safe for discharge.  This rash  may be linked to her questionable ulcerative colitis.  Does not appear to be pyoderma gangrenosum as there is no ulceration or wound.  Likely think it is vasculitis of some sort.  Low suspicion for any SJS or TENS given the negative Nikolsky sign and physical exam is not consistent with this.  She has been on 40 mg of prednisone daily until she sees a GI doctor.  Patient was unable to get a call back from [facility] after attending multiple times.  I was able to call [facility] and establish an appointment for her to be seen on August 18 at 3 PM as she was going to run out of her prednisone without follow-up.  Afraid of the patient being on 40 mg for any symptomatic time.  Patient was unsure if she would have enough to last her to the next appointment, so I did write her for a few 40 mg prednisone to last her to the appointment just in case.  Additionally, I recommended that she add on a Zyrtec daily to her medication regimen to help with the urticaria.  We discussed taking a Benadryl as needed although she should limit her doses.  I discussed with her to call her PCP tomorrow morning to see if she can either get an earlier appointment or for her to get an emergent referral to a dermatologist.  We discussed strict return precautions and red flag symptoms.  Recommended keeping well-hydrated with plenty of fluids as well as applying a topical emollient such as Aquaphor or Vaseline to the area.  Patient verbalized understanding and agrees to plan.  Patient is stable being discharged home in good condition.  I discussed this case with my attending physician who cosigned this note including patient's presenting symptoms, physical exam, and planned diagnostics and interventions. Attending physician stated agreement with plan or made changes to plan which were implemented.   Attending physician assessed patient at bedside.  Portions of this report may have been transcribed using voice recognition software. Every  effort was made to ensure accuracy; however, inadvertent computerized transcription errors may be present.  Final Clinical Impression(s) / ED Diagnoses Final diagnoses:  Rash  Urticaria  Tachycardia    Rx / DC Orders ED Discharge Orders          Ordered    predniSONE (DELTASONE) 20 MG tablet  Daily        07/08/22 1736              Sherrell Puller, [place] 07/09/22 1332    Pattricia Boss, MD 07/10/22 1455

## 2022-07-09 ENCOUNTER — Other Ambulatory Visit (HOSPITAL_COMMUNITY): Payer: Self-pay

## 2022-07-09 NOTE — Telephone Encounter (Signed)
Called patient. Patient went to [facility] ED yesterday for evaluation.   Patient also reports that she was able to get scheduled with GI specialist on 07/17/22.  Patient will keep follow up appointment with Dr. Ronnald Ramp on 8/15.  Talbot Grumbling, RN

## 2022-07-13 NOTE — Progress Notes (Unsigned)
    SUBJECTIVE:   CHIEF COMPLAINT / HPI:   Pt recently hospitalized from 7/22-7/26 for GI bleed and colonoscopy on 7/24 showed possible UC and she was started on prednisone taper. She was discharged with plans to follow up with GI out patient and has apt on ***.   Pt then seen in ED on 07/08/22 for a rash on her face arms and inner thighs, pictures in media tab and ED note. Her Hgb was low at 9.5 but stable from prior hospitalization. She was treated with one dose of solu-Medrol and refill of prednisone to last until GI visit.   Today pt states ***   PERTINENT  PMH / PSH: ***  OBJECTIVE:   LMP 06/22/2022 (Exact Date)  ***  General: NAD, pleasant, able to participate in exam Cardiac: RRR, no murmurs. Respiratory: CTAB, normal effort, No wheezes, rales or rhonchi Abdomen: Bowel sounds present, nontender, nondistended, no hepatosplenomegaly. Extremities: no edema or cyanosis. Skin: warm and dry, no rashes noted Neuro: alert, no obvious focal deficits Psych: Normal affect and mood  ASSESSMENT/PLAN:   No problem-specific Assessment & Plan notes found for this encounter.     Dr. Precious Gilding, [place]    {    This will disappear when note is signed, click to select method of visit    :1}

## 2022-07-14 ENCOUNTER — Other Ambulatory Visit: Payer: Self-pay

## 2022-07-14 ENCOUNTER — Encounter: Payer: Self-pay | Admitting: Student

## 2022-07-14 ENCOUNTER — Ambulatory Visit: Payer: 59 | Admitting: Student

## 2022-07-14 VITALS — BP 161/87 | HR 99 | Wt 277.0 lb

## 2022-07-14 DIAGNOSIS — K922 Gastrointestinal hemorrhage, unspecified: Secondary | ICD-10-CM | POA: Insufficient documentation

## 2022-07-14 DIAGNOSIS — R030 Elevated blood-pressure reading, without diagnosis of hypertension: Secondary | ICD-10-CM

## 2022-07-14 DIAGNOSIS — R21 Rash and other nonspecific skin eruption: Secondary | ICD-10-CM | POA: Insufficient documentation

## 2022-07-14 DIAGNOSIS — D500 Iron deficiency anemia secondary to blood loss (chronic): Secondary | ICD-10-CM | POA: Diagnosis not present

## 2022-07-14 NOTE — Patient Instructions (Signed)
It was great to see you! Thank you for allowing me to participate in your care!   Our plans for today:  -I recommend getting a home blood pressure cuff and keeping a log of your blood pressures daily.  Return with this log in about a month and we can evaluate whether or not you need to be placed on blood pressure medication. -Follow-up with GI this Friday and please call to schedule appointment with OB/GYN to discuss your fibroids and heavy bleeding.  Take care and seek immediate care sooner if you develop any concerns.   Dr. Precious Gilding, DO [facility]

## 2022-07-14 NOTE — Assessment & Plan Note (Signed)
GI bleed likely related to ulcerative colitis.  Patient will need further evaluation with GI -GI appointment on 8/18 -Continue prednisone 40 mg daily until his appointment

## 2022-07-14 NOTE — Assessment & Plan Note (Signed)
Rash likely related to ulcerative colitis.  It is much improved today from ED visit.  Patient to continue taking Zyrtec and prednisone daily

## 2022-07-14 NOTE — Assessment & Plan Note (Signed)
Hemoglobin recently checked a ED visit on 8/9 and stable, patient is asymptomatic and states she is feeling much better. no need to check hemoglobin today.   -Continue daily iron supplement -Follow-up with OB/GYN Y and regarding every menstrual cycles and fibroids -Follow with GI in regard to GI bleed

## 2022-07-14 NOTE — Assessment & Plan Note (Signed)
Repeat blood pressure still elevated at 161/87.  Patient agrees to obtain over-the-counter blood pressure cuff and monitor her blood pressure readings at home as she believes her blood pressure is elevated today due to anxiety related to whitecoat syndrome.  Patient to return in 1 month to go over blood pressure readings.

## 2022-07-17 ENCOUNTER — Other Ambulatory Visit (HOSPITAL_COMMUNITY): Payer: Self-pay

## 2022-07-17 DIAGNOSIS — K5100 Ulcerative (chronic) pancolitis without complications: Secondary | ICD-10-CM | POA: Diagnosis not present

## 2022-07-17 MED ORDER — MESALAMINE ER 0.375 G PO CP24
1.5000 g | ORAL_CAPSULE | ORAL | 0 refills | Status: DC
  Filled 2022-07-17: qty 120, 30d supply, fill #0

## 2022-07-20 ENCOUNTER — Other Ambulatory Visit (HOSPITAL_COMMUNITY): Payer: Self-pay

## 2022-07-25 ENCOUNTER — Encounter: Payer: Self-pay | Admitting: Student

## 2022-07-29 ENCOUNTER — Other Ambulatory Visit (HOSPITAL_COMMUNITY): Payer: Self-pay

## 2022-07-29 MED ORDER — OMRON 3 SERIES BP MONITOR DEVI
0 refills | Status: DC
  Filled 2022-07-29: qty 1, 1d supply, fill #0

## 2022-08-10 ENCOUNTER — Other Ambulatory Visit (HOSPITAL_COMMUNITY): Payer: Self-pay

## 2022-08-10 DIAGNOSIS — D649 Anemia, unspecified: Secondary | ICD-10-CM | POA: Diagnosis not present

## 2022-08-10 DIAGNOSIS — K5100 Ulcerative (chronic) pancolitis without complications: Secondary | ICD-10-CM | POA: Diagnosis not present

## 2022-08-11 ENCOUNTER — Other Ambulatory Visit (HOSPITAL_COMMUNITY): Payer: Self-pay

## 2022-08-11 MED ORDER — MESALAMINE ER 0.375 G PO CP24
1.5000 g | ORAL_CAPSULE | Freq: Every morning | ORAL | 1 refills | Status: DC
  Filled 2022-08-11: qty 120, 30d supply, fill #0
  Filled 2022-09-10: qty 120, 30d supply, fill #1

## 2022-08-14 ENCOUNTER — Encounter: Payer: Self-pay | Admitting: Student

## 2022-08-14 ENCOUNTER — Ambulatory Visit: Payer: 59 | Admitting: Family Medicine

## 2022-08-14 DIAGNOSIS — K5190 Ulcerative colitis, unspecified, without complications: Secondary | ICD-10-CM | POA: Insufficient documentation

## 2022-08-14 DIAGNOSIS — K51911 Ulcerative colitis, unspecified with rectal bleeding: Secondary | ICD-10-CM | POA: Diagnosis not present

## 2022-08-14 DIAGNOSIS — D500 Iron deficiency anemia secondary to blood loss (chronic): Secondary | ICD-10-CM | POA: Diagnosis not present

## 2022-08-14 NOTE — Patient Instructions (Signed)
It was wonderful to see you today.  Please bring ALL of your medications with you to every visit.   Today we talked about:  - Continuing your medications  - Your blood pressure looks good--please try to check about 1 time per week  - Call if you values are higher than 140/90  - Follow up in 3 months with Dr. Ronnald Ramp   Please follow up in 3 months   Thank you for choosing [facility].   Please call [phone] with any questions about today's appointment.  Please be sure to schedule follow up at the front  desk before you leave today.   Dorris Singh, MD  Family Medicine

## 2022-08-14 NOTE — Progress Notes (Signed)
    SUBJECTIVE:   CHIEF COMPLAINT / HPI: follow up Elevated blood pressure At last visit on 07/14/2022 BP elevated in 160s over 80s.  Patient has been checking blood pressure at home 120-133/80s. No symptoms related to HTN today. Reports anxiety around the doctor's office. Also has some anxiety related to recent hospital stay--[facility] sent a letter that they had denied coverage for the stay.  GI bleed Patient had appointment with GI on 8/18 to be evaluated for ulcerative colitis.She has since been started on mesalamine. She is off of prednisone. Had hemoglobin checked on 9/12 and it was in 9 range. She reports 1 day of UC symptoms with diarrhea and a small amount of blood. Symptoms improved immediately. She thinks trigger was sun chips and green tea. Complaint with medications.  AUB Patient has a history of heavy menses in addition to UC. This is thought to be fibroid related. LMP 9/7 with menses starting early, lasting 10 days, and heavier than usual. Last day of menses today. Denies excess fatigue, palpitations, or other symptoms of anemia. Hgb earlier this week 9.2.   PERTINENT  PMH / PSH: updated and reviewed   OBJECTIVE:   BP 128/72   Wt 274 lb (124.3 kg)   LMP 08/06/2022 (Exact Date)   BMI 47.03 kg/m    General: NAD, pleasant, able to participate in exam Cardiac: RRR, no murmurs. Respiratory: CTAB, normal effort, No wheezes, rales or rhonchi Abdomen: Bowel sounds present, nontender, nondistended, + hernia and fibroids palpable  Extremities: no edema or cyanosis. Skin: warm and dry, no rashes noted Neuro: alert, no obvious focal deficits Psych: Normal affect and mood  ASSESSMENT/PLAN:   Ulcerative colitis ([place]) Doing well on medications currently She is very aware of food and medication triggers Cannot take NSAIDs Tolerating mesalamine  Has GI Follow up   Iron deficiency anemia Continue PO iron Hgb 9, repeat at follow up  Etiology is both GI and genitourinary Follows  with [facility]--has follow up this week for fibroids    Elevated BP Improved today on repeat and at home She will monitor at home 1 time per week and message if >140/90  HCM Discussed Hep C today She would like to wait until next time   Follow up 3 months with PCP Ronnald Ramp) for Hep C and check in   Dorris Singh, MD  [facility] Medicine Teaching Service

## 2022-08-14 NOTE — Assessment & Plan Note (Signed)
Doing well on medications currently She is very aware of food and medication triggers Cannot take NSAIDs Tolerating mesalamine  Has GI Follow up

## 2022-08-14 NOTE — Assessment & Plan Note (Signed)
Continue PO iron Hgb 9, repeat at follow up  Etiology is both GI and genitourinary Follows with [facility]--has follow up this week for fibroids

## 2022-08-20 ENCOUNTER — Telehealth: Payer: Self-pay

## 2022-08-20 NOTE — Telephone Encounter (Signed)
Patient calls nurse line requesting PCP help in disputing an insurance claim.   Patient reports she received a letter from [facility] stating they would not pay for her ~5 day hospital stay as it was not medically necessary.   Patient reports she spoke with PCP about this and she is aware. PCP offered any help at the time of visit and told her to call if needed.   Patient reports she is in the process of filing an appeal. However, she was told her case would be helped if PCP reached out to insurance to discuss case.   Phone number and reference number below.   [phone] Reference # [id number]  Patient appreciative of any assistance.

## 2022-08-27 DIAGNOSIS — Z1231 Encounter for screening mammogram for malignant neoplasm of breast: Secondary | ICD-10-CM | POA: Diagnosis not present

## 2022-08-27 DIAGNOSIS — Z6841 Body Mass Index (BMI) 40.0 and over, adult: Secondary | ICD-10-CM | POA: Diagnosis not present

## 2022-08-27 DIAGNOSIS — D259 Leiomyoma of uterus, unspecified: Secondary | ICD-10-CM | POA: Diagnosis not present

## 2022-08-27 DIAGNOSIS — Z01411 Encounter for gynecological examination (general) (routine) with abnormal findings: Secondary | ICD-10-CM | POA: Diagnosis not present

## 2022-08-31 DIAGNOSIS — D649 Anemia, unspecified: Secondary | ICD-10-CM | POA: Diagnosis not present

## 2022-08-31 DIAGNOSIS — K5190 Ulcerative colitis, unspecified, without complications: Secondary | ICD-10-CM | POA: Diagnosis not present

## 2022-09-10 ENCOUNTER — Other Ambulatory Visit (HOSPITAL_COMMUNITY): Payer: Self-pay

## 2022-10-12 ENCOUNTER — Other Ambulatory Visit (HOSPITAL_COMMUNITY): Payer: Self-pay

## 2022-10-12 MED ORDER — MESALAMINE ER 0.375 G PO CP24
1.5000 g | ORAL_CAPSULE | Freq: Every morning | ORAL | 3 refills | Status: DC
  Filled 2022-10-12: qty 120, 30d supply, fill #0
  Filled 2022-11-03 – 2022-11-05 (×2): qty 120, 30d supply, fill #1
  Filled 2023-04-15: qty 120, 30d supply, fill #2
  Filled 2023-05-17: qty 120, 30d supply, fill #3

## 2022-10-14 ENCOUNTER — Other Ambulatory Visit (HOSPITAL_COMMUNITY): Payer: Self-pay

## 2022-10-20 ENCOUNTER — Other Ambulatory Visit (HOSPITAL_COMMUNITY): Payer: Self-pay

## 2022-10-20 MED ORDER — MYFEMBREE 40-1-0.5 MG PO TABS
1.0000 | ORAL_TABLET | Freq: Every day | ORAL | 5 refills | Status: DC
Start: 2022-10-20 — End: 2022-12-16
  Filled 2022-10-20 – 2022-11-04 (×4): qty 28, 28d supply, fill #0

## 2022-11-03 ENCOUNTER — Other Ambulatory Visit (HOSPITAL_COMMUNITY): Payer: Self-pay

## 2022-11-04 ENCOUNTER — Other Ambulatory Visit (HOSPITAL_COMMUNITY): Payer: Self-pay

## 2022-11-05 ENCOUNTER — Other Ambulatory Visit (HOSPITAL_COMMUNITY): Payer: Self-pay

## 2022-11-06 ENCOUNTER — Other Ambulatory Visit (HOSPITAL_COMMUNITY): Payer: Self-pay

## 2022-12-07 ENCOUNTER — Other Ambulatory Visit (HOSPITAL_COMMUNITY): Payer: Self-pay

## 2022-12-07 DIAGNOSIS — K5100 Ulcerative (chronic) pancolitis without complications: Secondary | ICD-10-CM | POA: Diagnosis not present

## 2022-12-07 MED ORDER — MESALAMINE ER 0.375 G PO CP24
1.5000 g | ORAL_CAPSULE | Freq: Every morning | ORAL | 11 refills | Status: DC
  Filled 2022-12-07: qty 120, 30d supply, fill #0
  Filled 2023-01-12: qty 120, 30d supply, fill #1
  Filled 2023-02-11: qty 120, 30d supply, fill #2
  Filled 2023-03-12 (×2): qty 120, 30d supply, fill #3
  Filled 2023-06-14: qty 120, 30d supply, fill #4
  Filled 2023-07-15: qty 120, 30d supply, fill #5
  Filled 2023-08-12: qty 120, 30d supply, fill #6
  Filled 2023-09-10: qty 120, 30d supply, fill #7
  Filled 2023-10-11: qty 120, 30d supply, fill #8
  Filled 2023-11-12: qty 120, 30d supply, fill #9

## 2022-12-14 ENCOUNTER — Ambulatory Visit: Payer: Commercial Managed Care - PPO | Admitting: Student

## 2022-12-14 NOTE — Progress Notes (Deleted)
    SUBJECTIVE:   CHIEF COMPLAINT / HPI:   ***  PERTINENT  PMH / PSH: Ulcerative colitis,  OBJECTIVE:   There were no vitals taken for this visit. ***  General: NAD, pleasant, able to participate in exam Cardiac: RRR, no murmurs. Respiratory: CTAB, normal effort, No wheezes, rales or rhonchi Abdomen: Bowel sounds present, nontender, nondistended, no hepatosplenomegaly. Extremities: no edema or cyanosis. Skin: warm and dry, no rashes noted Neuro: alert, no obvious focal deficits Psych: Normal affect and mood  ASSESSMENT/PLAN:   No problem-specific Assessment & Plan notes found for this encounter.     Dr. Precious Gilding, [place]    {    This will disappear when note is signed, click to select method of visit    :1}

## 2022-12-16 ENCOUNTER — Other Ambulatory Visit (HOSPITAL_COMMUNITY): Payer: Self-pay

## 2022-12-16 ENCOUNTER — Ambulatory Visit (INDEPENDENT_AMBULATORY_CARE_PROVIDER_SITE_OTHER): Payer: 59 | Admitting: Family Medicine

## 2022-12-16 VITALS — BP 121/96 | HR 74 | Ht 64.0 in | Wt 268.2 lb

## 2022-12-16 DIAGNOSIS — R112 Nausea with vomiting, unspecified: Secondary | ICD-10-CM | POA: Diagnosis not present

## 2022-12-16 MED ORDER — ONDANSETRON HCL 4 MG PO TABS
4.0000 mg | ORAL_TABLET | Freq: Three times a day (TID) | ORAL | 1 refills | Status: DC | PRN
  Filled 2022-12-16: qty 32, 11d supply, fill #0

## 2022-12-16 MED ORDER — MYFEMBREE 40-1-0.5 MG PO TABS: 1.0000 | ORAL_TABLET | Freq: Every day | ORAL | 5 refills | Status: DC

## 2022-12-16 NOTE — Patient Instructions (Signed)
I have called in some Zofran. I do not think this is infectious but if you start running a fever or get new symptoms, you need to be seen again. Take it EASY next 2-3 days.

## 2022-12-16 NOTE — Progress Notes (Signed)
Dorcas Mcmurray MD   CHIEF COMPLAINT / HPI: 4 to 6 days of nausea, decreased appetite.  Has history of ulcerative colitis and this does not feel like her typical symptoms which are diarrhea and lower GI pain.  She rarely has nausea with those episodes.  Does admit to significant increase in stressors both she and her fianc have experienced death in their immediate family within the last couple of weeks.  She has not been resting throughout this.  Was post to go to work today but did not.  She works in an office setting.   PERTINENT  PMH / PSH: I have reviewed the patient's medications, allergies, past medical and surgical history, smoking status and updated in the EMR as appropriate. History of ulcerative colitis diagnosed 2023 History of anemia last hemoglobin checked greater than 11  OBJECTIVE:  Ht '5\' 4"'$  (1.626 m)   Wt 268 lb 3.2 oz (121.7 kg)   BMI 46.04 kg/m  GENERAL: Well-developed female no acute distress ABDOMEN: Soft, positive bowel sounds CV: Regular rate and rhythm LUNGS: Clear to auscultation  ASSESSMENT / PLAN:  Nausea and vomiting in patient with known UC: I do not think this is related to an ulcerative colitis flare and neither does she.  I think it is probably related to excessive stressors with the to the acid in her family.  Will write her out of work, and see if she can destress at home for couple of days.  Ondansetron.  She is not improving, she needs to either be seen again or call her GI specialist.  Certainly if she has any worsening symptoms related to GI tract, I would call Dr. Michail Sermon.  She is in agreement with this plan. No problem-specific Assessment & Plan notes found for this encounter.   Dorcas Mcmurray MD

## 2023-01-12 ENCOUNTER — Other Ambulatory Visit: Payer: Self-pay

## 2023-01-14 ENCOUNTER — Other Ambulatory Visit (HOSPITAL_COMMUNITY): Payer: Self-pay

## 2023-02-11 ENCOUNTER — Other Ambulatory Visit (HOSPITAL_COMMUNITY): Payer: Self-pay

## 2023-03-12 ENCOUNTER — Other Ambulatory Visit (HOSPITAL_COMMUNITY): Payer: Self-pay

## 2023-03-12 DIAGNOSIS — K5100 Ulcerative (chronic) pancolitis without complications: Secondary | ICD-10-CM | POA: Diagnosis not present

## 2023-03-12 DIAGNOSIS — R21 Rash and other nonspecific skin eruption: Secondary | ICD-10-CM | POA: Diagnosis not present

## 2023-03-12 MED ORDER — PREDNISONE 10 MG PO TABS
ORAL_TABLET | ORAL | 3 refills | Status: AC
  Filled 2023-03-12: qty 120, 49d supply, fill #0
  Filled 2023-04-29: qty 120, 49d supply, fill #1

## 2023-04-01 DIAGNOSIS — D649 Anemia, unspecified: Secondary | ICD-10-CM | POA: Diagnosis not present

## 2023-04-01 DIAGNOSIS — K5100 Ulcerative (chronic) pancolitis without complications: Secondary | ICD-10-CM | POA: Diagnosis not present

## 2023-04-15 ENCOUNTER — Other Ambulatory Visit (HOSPITAL_COMMUNITY): Payer: Self-pay

## 2023-04-20 ENCOUNTER — Encounter: Payer: Self-pay | Admitting: Student

## 2023-04-29 ENCOUNTER — Other Ambulatory Visit (HOSPITAL_COMMUNITY): Payer: Self-pay

## 2023-05-03 ENCOUNTER — Other Ambulatory Visit (HOSPITAL_COMMUNITY): Payer: Self-pay

## 2023-05-03 MED ORDER — ONDANSETRON HCL 8 MG PO TABS
8.0000 mg | ORAL_TABLET | Freq: Four times a day (QID) | ORAL | 0 refills | Status: DC | PRN
  Filled 2023-05-03: qty 60, 15d supply, fill #0

## 2023-05-04 ENCOUNTER — Other Ambulatory Visit (HOSPITAL_COMMUNITY): Payer: Self-pay

## 2023-05-17 ENCOUNTER — Other Ambulatory Visit (HOSPITAL_COMMUNITY): Payer: Self-pay

## 2023-05-28 DIAGNOSIS — K5100 Ulcerative (chronic) pancolitis without complications: Secondary | ICD-10-CM | POA: Diagnosis not present

## 2023-06-14 ENCOUNTER — Other Ambulatory Visit (HOSPITAL_COMMUNITY): Payer: Self-pay

## 2023-07-15 ENCOUNTER — Other Ambulatory Visit (HOSPITAL_COMMUNITY): Payer: Self-pay

## 2023-07-20 DIAGNOSIS — K5100 Ulcerative (chronic) pancolitis without complications: Secondary | ICD-10-CM | POA: Diagnosis not present

## 2023-07-20 DIAGNOSIS — A09 Infectious gastroenteritis and colitis, unspecified: Secondary | ICD-10-CM | POA: Diagnosis not present

## 2023-07-27 ENCOUNTER — Other Ambulatory Visit (HOSPITAL_COMMUNITY): Payer: Self-pay

## 2023-07-27 MED ORDER — PREDNISONE 10 MG PO TABS
20.0000 mg | ORAL_TABLET | Freq: Every day | ORAL | 1 refills | Status: DC
  Filled 2023-07-27: qty 60, 30d supply, fill #0
  Filled 2023-08-30: qty 60, 30d supply, fill #1

## 2023-08-12 ENCOUNTER — Other Ambulatory Visit (HOSPITAL_COMMUNITY): Payer: Self-pay

## 2023-08-30 ENCOUNTER — Other Ambulatory Visit (HOSPITAL_COMMUNITY): Payer: Self-pay

## 2023-08-30 DIAGNOSIS — Z01411 Encounter for gynecological examination (general) (routine) with abnormal findings: Secondary | ICD-10-CM | POA: Diagnosis not present

## 2023-08-30 DIAGNOSIS — D251 Intramural leiomyoma of uterus: Secondary | ICD-10-CM | POA: Diagnosis not present

## 2023-08-30 DIAGNOSIS — N945 Secondary dysmenorrhea: Secondary | ICD-10-CM | POA: Diagnosis not present

## 2023-08-30 DIAGNOSIS — N920 Excessive and frequent menstruation with regular cycle: Secondary | ICD-10-CM | POA: Diagnosis not present

## 2023-08-30 DIAGNOSIS — Z1231 Encounter for screening mammogram for malignant neoplasm of breast: Secondary | ICD-10-CM | POA: Diagnosis not present

## 2023-09-06 DIAGNOSIS — K5100 Ulcerative (chronic) pancolitis without complications: Secondary | ICD-10-CM | POA: Diagnosis not present

## 2023-09-10 ENCOUNTER — Other Ambulatory Visit (HOSPITAL_COMMUNITY): Payer: Self-pay

## 2023-09-29 ENCOUNTER — Other Ambulatory Visit (HOSPITAL_COMMUNITY): Payer: Self-pay

## 2023-09-29 DIAGNOSIS — D251 Intramural leiomyoma of uterus: Secondary | ICD-10-CM | POA: Diagnosis not present

## 2023-09-29 DIAGNOSIS — D252 Subserosal leiomyoma of uterus: Secondary | ICD-10-CM | POA: Diagnosis not present

## 2023-09-29 MED ORDER — LUPRON DEPOT (3-MONTH) 11.25 MG IM KIT
1.0000 mg | PACK | Freq: Every day | INTRAMUSCULAR | 0 refills | Status: DC
  Filled 2023-09-29: qty 1, 11d supply, fill #0
  Filled 2023-10-27: qty 1, 30d supply, fill #0

## 2023-10-01 ENCOUNTER — Other Ambulatory Visit (HOSPITAL_COMMUNITY): Payer: Self-pay

## 2023-10-01 MED ORDER — ONDANSETRON 8 MG PO TBDP
8.0000 mg | ORAL_TABLET | Freq: Three times a day (TID) | ORAL | 0 refills | Status: AC | PRN
  Filled 2023-10-01: qty 90, 30d supply, fill #0

## 2023-10-04 ENCOUNTER — Other Ambulatory Visit (HOSPITAL_COMMUNITY): Payer: Self-pay

## 2023-10-08 ENCOUNTER — Other Ambulatory Visit (HOSPITAL_COMMUNITY): Payer: Self-pay

## 2023-10-08 MED ORDER — HYDROCORTISONE 100 MG/60ML RE ENEM
100.0000 mg | ENEMA | Freq: Every day | RECTAL | 0 refills | Status: DC
  Filled 2023-10-08: qty 1260, 21d supply, fill #0

## 2023-10-11 ENCOUNTER — Other Ambulatory Visit (HOSPITAL_COMMUNITY): Payer: Self-pay

## 2023-10-11 ENCOUNTER — Other Ambulatory Visit: Payer: Self-pay

## 2023-10-12 ENCOUNTER — Other Ambulatory Visit (HOSPITAL_COMMUNITY): Payer: Self-pay

## 2023-10-12 ENCOUNTER — Other Ambulatory Visit: Payer: Self-pay

## 2023-10-15 ENCOUNTER — Ambulatory Visit
Admission: RE | Admit: 2023-10-15 | Discharge: 2023-10-15 | Disposition: A | Discharge status: Home / Self Care | Payer: 59 | Source: Ambulatory Visit | Attending: Gastroenterology | Admitting: Gastroenterology

## 2023-10-15 ENCOUNTER — Other Ambulatory Visit: Payer: Self-pay | Admitting: Gastroenterology

## 2023-10-15 DIAGNOSIS — R112 Nausea with vomiting, unspecified: Secondary | ICD-10-CM

## 2023-10-15 DIAGNOSIS — K51918 Ulcerative colitis, unspecified with other complication: Secondary | ICD-10-CM

## 2023-10-20 DIAGNOSIS — Z862 Personal history of diseases of the blood and blood-forming organs and certain disorders involving the immune mechanism: Secondary | ICD-10-CM | POA: Diagnosis not present

## 2023-10-27 ENCOUNTER — Other Ambulatory Visit (HOSPITAL_COMMUNITY): Payer: Self-pay

## 2023-10-29 ENCOUNTER — Other Ambulatory Visit (HOSPITAL_COMMUNITY): Payer: Self-pay

## 2023-11-01 ENCOUNTER — Other Ambulatory Visit (HOSPITAL_COMMUNITY): Payer: Self-pay

## 2023-11-01 DIAGNOSIS — Z3042 Encounter for surveillance of injectable contraceptive: Secondary | ICD-10-CM | POA: Diagnosis not present

## 2023-11-06 DIAGNOSIS — K5100 Ulcerative (chronic) pancolitis without complications: Secondary | ICD-10-CM | POA: Diagnosis not present

## 2023-11-12 ENCOUNTER — Other Ambulatory Visit (HOSPITAL_COMMUNITY): Payer: Self-pay

## 2023-12-04 DIAGNOSIS — K5100 Ulcerative (chronic) pancolitis without complications: Secondary | ICD-10-CM | POA: Diagnosis not present

## 2023-12-15 ENCOUNTER — Other Ambulatory Visit (HOSPITAL_COMMUNITY): Payer: Self-pay

## 2023-12-15 ENCOUNTER — Other Ambulatory Visit: Payer: Self-pay

## 2023-12-15 MED ORDER — MESALAMINE ER 0.375 G PO CP24
1.5000 g | ORAL_CAPSULE | Freq: Every morning | ORAL | 11 refills | Status: AC
  Filled 2023-12-15: qty 120, 30d supply, fill #0
  Filled 2024-01-13: qty 120, 30d supply, fill #1
  Filled 2024-02-08: qty 120, 30d supply, fill #2
  Filled 2024-03-09: qty 120, 30d supply, fill #3
  Filled 2024-04-12: qty 120, 30d supply, fill #4
  Filled 2024-05-11: qty 120, 30d supply, fill #5
  Filled 2024-06-12: qty 120, 30d supply, fill #6
  Filled 2024-07-12 (×2): qty 120, 30d supply, fill #7
  Filled 2024-08-09: qty 120, 30d supply, fill #8
  Filled 2024-09-07: qty 120, 30d supply, fill #9
  Filled 2024-10-06: qty 120, 30d supply, fill #10
  Filled 2024-11-07: qty 120, 30d supply, fill #11

## 2024-01-01 DIAGNOSIS — K5100 Ulcerative (chronic) pancolitis without complications: Secondary | ICD-10-CM | POA: Diagnosis not present

## 2024-01-01 DIAGNOSIS — Z7689 Persons encountering health services in other specified circumstances: Secondary | ICD-10-CM | POA: Diagnosis not present

## 2024-01-11 ENCOUNTER — Other Ambulatory Visit (HOSPITAL_COMMUNITY): Payer: Self-pay

## 2024-01-12 ENCOUNTER — Other Ambulatory Visit: Payer: Self-pay | Admitting: Pharmacy Technician

## 2024-01-12 ENCOUNTER — Other Ambulatory Visit: Payer: Self-pay

## 2024-01-12 ENCOUNTER — Other Ambulatory Visit (HOSPITAL_COMMUNITY): Payer: Self-pay

## 2024-01-12 MED ORDER — SKYRIZI 180 MG/1.2ML ~~LOC~~ SOCT
SUBCUTANEOUS | 6 refills | Status: DC
Start: 2023-10-13 — End: 2024-01-13

## 2024-01-13 ENCOUNTER — Other Ambulatory Visit: Payer: Self-pay | Admitting: Pharmacist

## 2024-01-13 ENCOUNTER — Other Ambulatory Visit (HOSPITAL_COMMUNITY): Payer: Self-pay

## 2024-01-13 ENCOUNTER — Other Ambulatory Visit: Payer: Self-pay

## 2024-01-13 ENCOUNTER — Ambulatory Visit: Payer: Self-pay | Attending: Internal Medicine | Admitting: Pharmacist

## 2024-01-13 ENCOUNTER — Encounter: Payer: Self-pay | Admitting: Pharmacist

## 2024-01-13 DIAGNOSIS — Z7189 Other specified counseling: Secondary | ICD-10-CM

## 2024-01-13 MED ORDER — SKYRIZI 180 MG/1.2ML ~~LOC~~ SOCT
SUBCUTANEOUS | 6 refills | Status: AC
  Filled 2024-01-13: qty 1.2, fill #0
  Filled 2024-01-14: qty 1.2, 56d supply, fill #0
  Filled 2024-03-01: qty 1.2, 56d supply, fill #1
  Filled 2024-05-10: qty 1.2, 56d supply, fill #2
  Filled 2024-06-28: qty 1.2, 56d supply, fill #3
  Filled 2024-08-22: qty 1.2, 30d supply, fill #4
  Filled 2024-10-19: qty 1.2, 30d supply, fill #5
  Filled 2024-12-12: qty 1.2, 30d supply, fill #6

## 2024-01-13 NOTE — Progress Notes (Signed)
Please see OV from 01/13/24 for complete documentation.

## 2024-01-13 NOTE — Progress Notes (Signed)
   S: Patient presents for review of their specialty medication therapy.  Patient is currently taking Skyrizi for UC. Patient is managed by Dr. Bosie Clos for this.   Adherence: confirmed. Just completed IV induction phase.   Efficacy: reports that the medication is a life-changer for her!   Dosing: UC - 1200 mg IV induction at week 0, week 4, and week 8 followed by 180mg  subcutaneous at week 12 then once ever 8 weeks thereafter.   Screening: TB test: completed   Monitoring: S/sx of infection: none  S/sx of hypersensitivity/injection site reaction: none   O:     Lab Results  Component Value Date   WBC 11.1 (H) 07/08/2022   HGB 9.5 (L) 07/08/2022   HCT 33.3 (L) 07/08/2022   MCV 73.7 (L) 07/08/2022   PLT 336 07/08/2022      Chemistry      Component Value Date/Time   NA 136 07/08/2022 1424   K 3.8 07/08/2022 1424   CL 107 07/08/2022 1424   CO2 21 (L) 07/08/2022 1424   BUN 11 07/08/2022 1424   CREATININE 0.85 07/08/2022 1424      Component Value Date/Time   CALCIUM 8.5 (L) 07/08/2022 1424   ALKPHOS 49 07/08/2022 1424   AST 18 07/08/2022 1424   ALT 9 07/08/2022 1424   BILITOT 1.0 07/08/2022 1424       A/P: 1. Medication review: Patient currently on Skyrizi for UC. Reviewed the medication with the patient, including the following: Cristy Folks is a monoclonal antibody used in the treatment of UC. Patient educated on purpose, proper use and potential adverse effects of Skyrizi. Possible adverse effects are infections, headache, and injection site reactions. Live vaccinations should be avoided while on therapy. SubQ: Administer the two consecutive injections subcutaneously at different anatomic locations, such as thighs, abdomen, or back of upper arms. Do not inject into areas where the skin is tender, bruised, red, hard, or affected by psoriasis. Intended for use under supervision of a health care professional; self-injection may occur after proper training (except back of  upper arms). No recommendations for any changes at this time.  Butch Penny, PharmD, Patsy [place], CPP Clinical Pharmacist [facility] [phone]

## 2024-01-14 ENCOUNTER — Other Ambulatory Visit: Payer: Self-pay

## 2024-01-14 ENCOUNTER — Other Ambulatory Visit (HOSPITAL_COMMUNITY): Payer: Self-pay

## 2024-01-14 NOTE — Progress Notes (Signed)
Specialty Pharmacy Initial Fill Coordination Note  Dana Campbell is a 43 y.o. female contacted today regarding initial fill of specialty medication(s) [name]   Patient requested [name] at [facility] at [place] date: 01/27/24   Medication will be filled on 2/26.   Patient is aware of $0 copayment.

## 2024-01-14 NOTE — Progress Notes (Addendum)
Pharmacy Patient Advocate Encounter  Insurance verification completed.   The patient is insured through [facility]   Ran test claim for [employer]. Currently a quantity of 1.2 ml is a 56 day supply and the co-pay is $110.06.   Copay with Skyrizi copay card is $0.  This test claim was processed through [facility]- copay amounts may vary at other pharmacies due to pharmacy/plan contracts, or as the patient moves through the different stages of their insurance plan.

## 2024-01-17 ENCOUNTER — Other Ambulatory Visit: Payer: Self-pay

## 2024-01-19 ENCOUNTER — Other Ambulatory Visit (HOSPITAL_COMMUNITY): Payer: Self-pay

## 2024-01-19 ENCOUNTER — Encounter (HOSPITAL_COMMUNITY): Payer: Self-pay

## 2024-01-19 MED ORDER — LUPRON DEPOT (3-MONTH) 11.25 MG IM KIT
11.2500 mg | PACK | INTRAMUSCULAR | 0 refills | Status: DC
  Filled 2024-01-19 – 2024-02-08 (×2): qty 1, 30d supply, fill #0

## 2024-01-21 DIAGNOSIS — K5100 Ulcerative (chronic) pancolitis without complications: Secondary | ICD-10-CM | POA: Diagnosis not present

## 2024-01-26 ENCOUNTER — Other Ambulatory Visit: Payer: Self-pay

## 2024-01-26 NOTE — Progress Notes (Signed)
 Clinical Intervention Note  Clinical Intervention Notes: Patient contacted pharmacy requesting a pharmacist to assist her in person with initial dose of Skyrizi OBI. Informed that while we do not have a pharmacist who can provide this service in person, I can provide her detailed instructions over the phone, resources, and support in self administration. Provided patient with links to patient level OBI instructions from manufacturer website as well as administration video. She states that she has a nurse advocate through [employer] [employer] also who offered to provide support via facetime for initial dose which she will take advantage of. Advised her to review support materials provided and open package once she picks up initial dose to begin to familiarize herself with the device and to call us with any additional questions or concerns she may have.   Clinical Intervention Outcomes: Improved therapy adherence   Otto Herb Specialty Pharmacist

## 2024-02-08 ENCOUNTER — Other Ambulatory Visit (HOSPITAL_COMMUNITY): Payer: Self-pay

## 2024-02-09 ENCOUNTER — Other Ambulatory Visit (HOSPITAL_COMMUNITY): Payer: Self-pay

## 2024-03-01 ENCOUNTER — Other Ambulatory Visit: Payer: Self-pay

## 2024-03-01 NOTE — Progress Notes (Signed)
 Specialty Pharmacy Refill Coordination Note  Dana Campbell is a 43 y.o. female contacted today regarding refills of specialty medication(s) [name]   Patient requested (Patient-Rptd) [name] at [facility] at [employer] date: (Patient-Rptd) 03/23/24   Medication will be filled on 03/22/24.

## 2024-03-06 ENCOUNTER — Other Ambulatory Visit (HOSPITAL_COMMUNITY): Payer: Self-pay

## 2024-03-06 NOTE — Progress Notes (Signed)
 Patient called and wants to pick up medication a few days earlier as she took her previous injection a few days earlier due to having symptoms. Fill 04/18, ppu 04/21.

## 2024-03-09 ENCOUNTER — Other Ambulatory Visit (HOSPITAL_COMMUNITY): Payer: Self-pay

## 2024-03-10 ENCOUNTER — Other Ambulatory Visit: Payer: Self-pay | Admitting: Obstetrics & Gynecology

## 2024-03-13 DIAGNOSIS — K429 Umbilical hernia without obstruction or gangrene: Secondary | ICD-10-CM | POA: Diagnosis not present

## 2024-03-17 ENCOUNTER — Other Ambulatory Visit: Payer: Self-pay

## 2024-04-12 ENCOUNTER — Other Ambulatory Visit (HOSPITAL_COMMUNITY): Payer: Self-pay

## 2024-04-17 DIAGNOSIS — D219 Benign neoplasm of connective and other soft tissue, unspecified: Secondary | ICD-10-CM | POA: Diagnosis not present

## 2024-04-17 DIAGNOSIS — N921 Excessive and frequent menstruation with irregular cycle: Secondary | ICD-10-CM | POA: Diagnosis not present

## 2024-04-17 DIAGNOSIS — D251 Intramural leiomyoma of uterus: Secondary | ICD-10-CM | POA: Diagnosis not present

## 2024-04-20 ENCOUNTER — Other Ambulatory Visit: Payer: Self-pay | Admitting: Obstetrics & Gynecology

## 2024-05-02 ENCOUNTER — Encounter (HOSPITAL_COMMUNITY): Payer: Self-pay | Admitting: Obstetrics & Gynecology

## 2024-05-02 NOTE — Progress Notes (Addendum)
 Spoke w/ via phone for pre-op interview--- pt Lab needs dos----  urine preg (per anes)       Lab results------ lab appt 05-05-2024 @ 1300 getting CBC/ T&S (per anes) COVID test -----patient states asymptomatic no test needed Arrive at ------- 0900 on 05-08-2024 NPO after MN w/ exception sips of water w/ meds Pre-Surgery Ensure or G2:  n/a  Med rec completed Medications to take morning of surgery ----- mesalamine  Diabetic medication ----- n/a  GLP1 agonist last dose: n/a GLP1 instructions:  Patient instructed no nail polish to be worn day of surgery Patient instructed to bring photo id and insurance card day of surgery Patient aware to have Driver (ride ) / caregiver    for 24 hours after surgery - spouse, erica ines Patient Special Instructions -----  will pick up bag w/ soap and written instructions at lab appt Pre-Op special Instructions ----- n/a  Patient verbalized understanding of instructions that were given at this phone interview. Patient denies chest pain, sob, fever, cough at the interview.   Anesthesia review:  Ulcerative pancolitis dx 07/ 2023;  pt stated currently having a mild  flare-up she has spoke w/ her GI doctor Dr Honey Lusty.  Was told it was due to stress and anemia. Pt grandmother recently died , pt has moved to new house, and surgery coming up .  Pt stated Dr Honey Lusty is aware of her surgery his advice was to reduce her stress and be relax.  Pt verbalized understanding if she were to have any other GI issues to call Dr Honey Lusty office for advice.

## 2024-05-02 NOTE — Pre-Procedure Instructions (Signed)
 Surgical Instructions   Your procedure is scheduled on :  Monday,  05-08-2024. Report to [facility] Main Entrance "A" at 9:00  A.M., then check in with the Admitting office. Any questions or running late day of surgery: call [phone]  Questions prior to your surgery date: call [phone], Monday-Friday, 8am-4pm. If you experience any cold or flu symptoms such as cough, fever, chills, shortness of breath, etc. between now and your scheduled surgery, please notify your surgeon office.    Remember:  Do not eat any food and do not drink any liquids after midnight the night before your surgery.  This includes no water,  candy,  gum,  and  mints.    Take these medicines the morning of surgery with A SIPS OF WATER : Mesalamine  (apriso )   May take these medicines IF NEEDED:  none    One week prior to surgery, STOP taking any Aspirin (unless otherwise instructed by your surgeon) Aleve, Naproxen, Ibuprofen , Motrin , Advil , Goody's, BC's, all herbal medications, fish oil, and non-prescription vitamins.                     Do NOT Smoke (Tobacco/Vaping) and Do Not drink alcohol for 24 hours prior to your procedure.  If you use a CPAP at night, you may bring your mask/headgear for your overnight stay.   You will be asked to remove any contacts, glasses, piercing's, hearing aid's, dentures/partials prior to surgery. Please bring cases / contacts container and solution for these items if needed.    Patients discharged the day of surgery will not be allowed to drive home, and someone needs to stay with them for 24 hours.  SURGICAL WAITING ROOM VISITATION Patients may have no more than 2 support people in the waiting area - these visitors may rotate.   Pre-op nurse will coordinate an appropriate time for 1 ADULT support person, who may not rotate, to accompany patient in pre-op.  Children under the age of 73 must have an adult with them who is not the patient and must remain in the main  waiting area with an adult.  If the patient needs to stay at the hospital during part of their recovery, the visitor guidelines for inpatient rooms apply.  Please refer to the [facility] website for the visitor guidelines for any additional information.   If you received a COVID test during your pre-op visit  it is requested that you wear a mask when out in public, stay away from anyone that may not be feeling well and notify your surgeon if you develop symptoms. If you have been in contact with anyone that has tested positive in the last 10 days please notify you surgeon.      Pre-operative CHG Bathing Instructions   You can play a key role in reducing the risk of infection after surgery. Your skin needs to be as free of germs as possible. You can reduce the number of germs on your skin by washing with CHG (chlorhexidine gluconate) soap before surgery. CHG is an antiseptic soap that kills germs and continues to kill germs even after washing.   DO NOT use if you have an allergy to chlorhexidine/CHG or antibacterial soaps. If your skin becomes reddened or irritated, stop using the CHG and notify Pre-op nurse day of surgery.  ~Please get bar of dial soap or other antibacterial soap ( no scent) and shower following the instructions below.  TAKE A SHOWER THE NIGHT BEFORE SURGERY AND THE DAY OF SURGERY    Please keep in mind the following:  DO NOT shave, including legs and underarms, 48 hours prior to surgery.   You may shave your face before/day of surgery.  Place clean sheets on your bed the night before surgery Use a clean washcloth (not used since being washed) for each shower. DO NOT sleep with pet's night before surgery.  CHG Shower Instructions:  Wash your face and private area with normal soap. If you choose to wash your hair, wash first with your normal shampoo.  After you use shampoo/soap, rinse your hair and body thoroughly to remove shampoo/soap residue.  Turn the  water OFF and apply half the bottle of CHG soap to a CLEAN washcloth.  Apply CHG soap ONLY FROM YOUR NECK DOWN TO YOUR TOES (washing for 3-5 minutes)  DO NOT use CHG soap on face, private areas, open wounds, or sores.  Pay special attention to the area where your surgery is being performed.  If you are having back surgery, having someone wash your back for you may be helpful. Wait 2 minutes after CHG soap is applied, then you may rinse off the CHG soap.  Pat dry with a clean towel  Put on clean pajamas    Additional instructions for the day of surgery: DO NOT APPLY any lotions,  powder,  oils,  deodorants (may use underarm deodorant) , cologne/  perfumes  or makeup Do not wear jewelry / piercing's/  metal/  permanent jewelry must be removed prior to arrival day of surgery.  (No plastic piercing) Do not wear nail polish, gel polish, artificial nails, or any other type of covering on natural finger nails (toe nails are okay) Do not bring valuables to the hospital. [facility] is not responsible for valuables/personal belongings. Put on clean/comfortable clothes.  Please brush your teeth.  Ask your nurse before applying any prescription medications to the skin.

## 2024-05-05 ENCOUNTER — Inpatient Hospital Stay (HOSPITAL_COMMUNITY): Admission: RE | Admit: 2024-05-05 | Source: Ambulatory Visit

## 2024-05-08 ENCOUNTER — Encounter (HOSPITAL_COMMUNITY): Admission: RE | Payer: Self-pay | Source: Home / Self Care

## 2024-05-08 ENCOUNTER — Ambulatory Visit (HOSPITAL_COMMUNITY): Admission: RE | Admit: 2024-05-08 | Payer: Self-pay | Source: Home / Self Care | Admitting: Obstetrics & Gynecology

## 2024-05-08 DIAGNOSIS — Z01818 Encounter for other preprocedural examination: Secondary | ICD-10-CM

## 2024-05-08 HISTORY — DX: Gastro-esophageal reflux disease without esophagitis: K21.9

## 2024-05-08 HISTORY — DX: Other seasonal allergic rhinitis: J30.2

## 2024-05-08 HISTORY — DX: Unspecified hemorrhoids: K64.9

## 2024-05-08 HISTORY — DX: Iron deficiency anemia, unspecified: D50.9

## 2024-05-08 HISTORY — DX: Leiomyoma of uterus, unspecified: D25.9

## 2024-05-08 HISTORY — DX: Umbilical hernia without obstruction or gangrene: K42.9

## 2024-05-08 HISTORY — DX: Presence of spectacles and contact lenses: Z97.3

## 2024-05-08 SURGERY — HYSTERECTOMY, ABDOMINAL
Anesthesia: General

## 2024-05-09 ENCOUNTER — Encounter: Payer: Self-pay | Admitting: *Deleted

## 2024-05-10 ENCOUNTER — Other Ambulatory Visit: Payer: Self-pay

## 2024-05-10 ENCOUNTER — Other Ambulatory Visit: Payer: Self-pay | Admitting: Pharmacy Technician

## 2024-05-10 NOTE — Progress Notes (Signed)
 Specialty Pharmacy Refill Coordination Note  Dana Campbell is a 43 y.o. female contacted today regarding refills of specialty medication(s) Risankizumab -rzaa (Skyrizi )   Patient requested [name] at [facility] at [place] date: 05/11/24   Medication will be filled on 05/10/24.

## 2024-05-11 ENCOUNTER — Other Ambulatory Visit (HOSPITAL_COMMUNITY): Payer: Self-pay

## 2024-05-12 ENCOUNTER — Other Ambulatory Visit (HOSPITAL_COMMUNITY): Payer: Self-pay

## 2024-05-15 ENCOUNTER — Ambulatory Visit: Admitting: Student

## 2024-05-15 ENCOUNTER — Encounter: Payer: Self-pay | Admitting: Student

## 2024-05-15 ENCOUNTER — Other Ambulatory Visit: Payer: Self-pay

## 2024-05-15 VITALS — BP 120/80 | HR 109 | Ht 64.0 in | Wt 262.2 lb

## 2024-05-15 DIAGNOSIS — D500 Iron deficiency anemia secondary to blood loss (chronic): Secondary | ICD-10-CM

## 2024-05-15 DIAGNOSIS — K51911 Ulcerative colitis, unspecified with rectal bleeding: Secondary | ICD-10-CM

## 2024-05-15 DIAGNOSIS — R000 Tachycardia, unspecified: Secondary | ICD-10-CM

## 2024-05-15 DIAGNOSIS — D259 Leiomyoma of uterus, unspecified: Secondary | ICD-10-CM | POA: Diagnosis not present

## 2024-05-15 LAB — POCT HEMOGLOBIN: Hemoglobin: 12 g/dL (ref 11–14.6)

## 2024-05-15 NOTE — Progress Notes (Unsigned)
    SUBJECTIVE:   CHIEF COMPLAINT / HPI:   Dana Campbell is a 43 year old female with ulcerative colitis and uterine fibroids who presents with a flare-up of ulcerative colitis and concerns about anemia.  She is experiencing a flare-up of ulcerative colitis that began three weeks ago, with symptoms of bloody stools, although no blood has been noticed in the past week.  She has occasional nausea and vomiting associated with the flare-up but none today.  She is managed with mesalamine  and she took her Skyrizi  dose on Friday night, which she receives every eight weeks, and it usually provides relief. An appointment with her GI doctor is scheduled for next week.  She experiences heavy periods due to fibroids, leading to anemia in the past. Her last hemoglobin check was two weeks ago, showing a level of 10. A full hysterectomy is planned with [place] surgery which had to be postponed due to the UC flare.  States her mild tachycardia is likely due to her nervousness of being at the doctor.  She denies any current pain, chest pain, shortness of breath or lightheadedness.  PERTINENT  PMH / PSH: UC, IDA due to menorrhagia and fibroids  OBJECTIVE:   BP 120/80   Pulse (!) 109   Ht 5' 4 (1.626 m)   Wt 262 lb 3.2 oz (118.9 kg)   SpO2 95%   BMI 45.01 kg/m    General: NAD, pleasant, well-appearing Cardiac: Tachycardic, regular rhythm, no murmur Respiratory: CTAB, normal effort, No wheezes, rales or rhonchi Abdomen: Bowel sounds present, nontender, nondistended, soft Skin: warm and dry Neuro: alert, no obvious focal deficits Psych: Normal affect and mood  ASSESSMENT/PLAN:   Ulcerative colitis (HCC) Ulcerative colitis managed with Skyrizi  and mesalamine  prescribed by GI. Recent flare-up now improving, abdominal exam benign.  Point-of-care hemoglobin stable at 12.  GI specialist has been informed, appointment scheduled. - Continue to follow with GI and continue current medications -  I will complete FMLA paperwork for her work  Tachycardia Per patient likely due to to stress of being at the doctor.  Sinus tachycardia on EKG and she is asymptomatic.  Will rule out other etiologies such as thyroid dysfunction, electrolyte abnormalities, and confirm normal blood counts with TSH, CBC and BMP  Uterine fibroid Causing menorrhagia and being managed by OB/GYN with plans for total hysterectomy with [place] surgery -Hemoglobin stable at 12 -Plan to reschedule hysterectomy once UC flare has resolved     Dr. Glenn Lange, DO [facility]

## 2024-05-15 NOTE — Patient Instructions (Addendum)
 It was great to see you! Thank you for allowing me to participate in your care!  I recommend that you always bring your medications to each appointment as this makes it easy to ensure you are on the correct medications and helps us  not miss when refills are needed.  Our plans for today:  - Hemoglobin is stable at 12 - Recommend following up with your GI doctor ASAP - I recommend returning for an annual wellness visit and meeting your new PCP within the next month - I will call with any more questions about your FMLA paperwork   Take care and seek immediate care sooner if you develop any concerns.   Dr. Glenn Lange, DO [facility]

## 2024-05-16 ENCOUNTER — Ambulatory Visit: Payer: Self-pay | Admitting: Student

## 2024-05-16 LAB — BASIC METABOLIC PANEL WITH GFR
BUN/Creatinine Ratio: 13 (ref 9–23)
BUN: 8 mg/dL (ref 6–24)
CO2: 19 mmol/L — ABNORMAL LOW (ref 20–29)
Calcium: 9.1 mg/dL (ref 8.7–10.2)
Chloride: 98 mmol/L (ref 96–106)
Creatinine, Ser: 0.6 mg/dL (ref 0.57–1.00)
Glucose: 64 mg/dL — ABNORMAL LOW (ref 70–99)
Potassium: 4.6 mmol/L (ref 3.5–5.2)
Sodium: 135 mmol/L (ref 134–144)
eGFR: 114 mL/min/{1.73_m2} (ref >59)

## 2024-05-16 LAB — TSH: TSH: 1.14 u[IU]/mL (ref 0.450–4.500)

## 2024-05-17 DIAGNOSIS — R000 Tachycardia, unspecified: Secondary | ICD-10-CM | POA: Insufficient documentation

## 2024-05-17 DIAGNOSIS — D259 Leiomyoma of uterus, unspecified: Secondary | ICD-10-CM | POA: Insufficient documentation

## 2024-05-17 NOTE — Assessment & Plan Note (Signed)
 Causing menorrhagia and being managed by OB/GYN with plans for total hysterectomy with [place] surgery -Hemoglobin stable at 12 -Plan to reschedule hysterectomy once UC flare has resolved

## 2024-05-17 NOTE — Assessment & Plan Note (Signed)
 Per patient likely due to to stress of being at the doctor.  Sinus tachycardia on EKG and she is asymptomatic.  Will rule out other etiologies such as thyroid dysfunction, electrolyte abnormalities, and confirm normal blood counts with TSH, CBC and BMP

## 2024-05-17 NOTE — Assessment & Plan Note (Addendum)
 Ulcerative colitis managed with Skyrizi  and mesalamine  prescribed by GI. Recent flare-up now improving, abdominal exam benign.  Point-of-care hemoglobin stable at 12.  GI specialist has been informed, appointment scheduled. - Continue to follow with GI and continue current medications - I will complete FMLA paperwork for her work

## 2024-05-29 DIAGNOSIS — K5100 Ulcerative (chronic) pancolitis without complications: Secondary | ICD-10-CM | POA: Diagnosis not present

## 2024-06-09 ENCOUNTER — Other Ambulatory Visit (HOSPITAL_COMMUNITY): Payer: Self-pay

## 2024-06-12 ENCOUNTER — Other Ambulatory Visit (HOSPITAL_COMMUNITY): Payer: Self-pay

## 2024-06-28 ENCOUNTER — Other Ambulatory Visit: Payer: Self-pay

## 2024-06-28 ENCOUNTER — Encounter (INDEPENDENT_AMBULATORY_CARE_PROVIDER_SITE_OTHER): Payer: Self-pay

## 2024-06-28 NOTE — Progress Notes (Signed)
 Specialty Pharmacy Refill Coordination Note  Dana Campbell is a 43 y.o. female contacted today regarding refills of specialty medication(s) Risankizumab -rzaa (Skyrizi )   Patient requested (Patient-Rptd) Pickup at [facility] at [facility] date: (Patient-Rptd) 07/03/24   Medication will be filled on 06/30/24.

## 2024-06-29 ENCOUNTER — Other Ambulatory Visit: Payer: Self-pay

## 2024-06-29 ENCOUNTER — Other Ambulatory Visit (HOSPITAL_COMMUNITY): Payer: Self-pay

## 2024-07-04 ENCOUNTER — Other Ambulatory Visit: Payer: Self-pay

## 2024-07-12 ENCOUNTER — Other Ambulatory Visit (HOSPITAL_COMMUNITY): Payer: Self-pay

## 2024-08-09 ENCOUNTER — Other Ambulatory Visit (HOSPITAL_COMMUNITY): Payer: Self-pay

## 2024-08-11 ENCOUNTER — Encounter (HOSPITAL_BASED_OUTPATIENT_CLINIC_OR_DEPARTMENT_OTHER): Payer: Self-pay | Admitting: Emergency Medicine

## 2024-08-11 ENCOUNTER — Other Ambulatory Visit: Payer: Self-pay

## 2024-08-11 ENCOUNTER — Other Ambulatory Visit (HOSPITAL_BASED_OUTPATIENT_CLINIC_OR_DEPARTMENT_OTHER): Payer: Self-pay

## 2024-08-11 ENCOUNTER — Emergency Department (HOSPITAL_BASED_OUTPATIENT_CLINIC_OR_DEPARTMENT_OTHER)
Admission: EM | Admit: 2024-08-11 | Discharge: 2024-08-11 | Disposition: A | Discharge status: Home / Self Care | Attending: Emergency Medicine | Admitting: Emergency Medicine

## 2024-08-11 DIAGNOSIS — L03011 Cellulitis of right finger: Secondary | ICD-10-CM | POA: Diagnosis not present

## 2024-08-11 MED ORDER — DOXYCYCLINE HYCLATE 100 MG PO CAPS
100.0000 mg | ORAL_CAPSULE | Freq: Two times a day (BID) | ORAL | 0 refills | Status: AC
Start: 2024-08-11 — End: 2024-08-16
  Filled 2024-08-11: qty 10, 5d supply, fill #0

## 2024-08-11 NOTE — ED Notes (Signed)
Discharge instructions, follow up care, and prescription reviewed and explained, pt verbalized understanding and had no further questions on d/c.  

## 2024-08-11 NOTE — ED Triage Notes (Signed)
 Pt Dana Campbell, ambulatory c/o pain and swelling to R pointer finger stating she pulled a hang nail approx 1 wk ago and over the past week it has had increased redness and swelling.

## 2024-08-11 NOTE — Discharge Instructions (Addendum)
 Continue warm water soaks at home and start antibiotics return if symptoms worsen.

## 2024-08-11 NOTE — ED Provider Notes (Signed)
 [place] EMERGENCY DEPARTMENT AT [facility] Provider Note   CSN: [id number] Arrival date & time: 08/11/24  9263     Patient presents with: No chief complaint on file.   Dana Campbell is a 43 y.o. female.   Patient here for right index finger infection.  She states that she cut a hangnail off about a week ago and started to notice some increased swelling redness at the base of her nail in the right index finger.  Denies any weakness numbness tingling.  No history of diabetes.  The history is provided by the patient.       Prior to Admission medications   Medication Sig Start Date End Date Taking? Authorizing Provider  doxycycline  (VIBRAMYCIN ) 100 MG capsule Take 1 capsule (100 mg total) by mouth 2 (two) times daily for 5 days. 08/11/24 08/16/24 Yes Darrio Bade, DO  acetaminophen  (TYLENOL ) 500 MG tablet Take 1,000 mg by mouth every 8 (eight) hours as needed (pain).    [provider]  calcium carbonate (TUMS - DOSED IN MG ELEMENTAL CALCIUM) 500 MG chewable tablet Chew 1 tablet by mouth as needed for indigestion or heartburn.    [provider]  cetirizine  (ZYRTEC ) 10 MG tablet Take 10 mg by mouth at bedtime as needed for allergies.    [provider]  ferrous sulfate 325 (65 FE) MG EC tablet Take 325 mg by mouth daily.    [provider]  leuprolide  (LUPRON  DEPOT, 88-MONTH,) 11.25 MG injection Inject 11.25 mg into the muscle every 3 (three) months as directed Patient taking differently: Inject 11.25 mg into the muscle every 3 (three) months. 01/19/24     mesalamine  (APRISO ) 0.375 g 24 hr capsule Take 4 capsules (1.5 g total) by mouth every morning. Patient taking differently: Take 1.5 g by mouth every morning. 12/15/23     mesalamine  (APRISO ) 0.375 g 24 hr capsule Take 4 capsules Orally Once a day (1.5 g total)    [provider]  Multiple Vitamins-Minerals (EMERGEN-C IMMUNE PLUS PO) Take 1 capsule by mouth daily.    [provider]  ondansetron  (ZOFRAN -ODT) 8 MG disintegrating tablet Take 1 tablet (8 mg total) by mouth and allow to dissolve every 8 (eight) hours as needed for nausea. Patient taking differently: Take 8 mg by mouth every 8 (eight) hours as needed for nausea or vomiting. 10/01/23     Risankizumab -rzaa (SKYRIZI ) 180 MG/1.2ML SOCT Inject 180mg  subcutanously at week 12, then every 8 weeks Patient taking differently: Inject 180 mg into the skin every 8 (eight) weeks. 01/13/24   Jegede, Olugbemiga E, MD    Allergies: Cinnamon, Nsaids, and Protonix  [pantoprazole ]    Review of Systems  Updated Vital Signs BP (!) 135/93 (BP Location: Right Arm)   Pulse (!) 116   Temp 97.9 F (36.6 C)   Resp 20   Ht 5' 4 (1.626 m)   Wt 113.4 kg   LMP 07/09/2024 (Approximate)   SpO2 100%   BMI 42.91 kg/m   Physical Exam Constitutional:      General: She is not in acute distress.    Appearance: She is not ill-appearing.  Cardiovascular:     Pulses: Normal pulses.  Pulmonary:     Effort: Pulmonary effort is normal.  Musculoskeletal:        General: Tenderness present. Normal range of motion.  Skin:    General: Skin is warm.     Comments: Paronychia to the right index finger small amount of swelling  just below the nailbed  Neurological:     General: No focal deficit present.     Mental Status: She is alert.     Sensory: No sensory deficit.     Motor: No weakness.     (all labs ordered are listed, but only abnormal results are displayed) Labs Reviewed - No data to display  EKG: None  Radiology: No results found.   .Incision and Drainage  Date/Time: 08/11/2024 8:10 AM  Performed by: Ruthe Cornet, DO Authorized by: Ruthe Cornet, DO   Consent:    Consent obtained:  Verbal   Consent given by:  Patient   Risks, benefits, and alternatives were discussed: yes     Risks discussed:  Bleeding, infection, incomplete drainage, damage to other organs and pain   Alternatives discussed:  No  treatment Universal protocol:    Procedure explained and questions answered to patient or proxy's satisfaction: yes     Patient identity confirmed:  Verbally with patient Location:    Indications for incision and drainage: Paronychia.   Location: Right index finger. Pre-procedure details:    Skin preparation:  Chlorhexidine Sedation:    Sedation type:  None Anesthesia:    Anesthesia method:  None Procedure type:    Complexity:  Simple Procedure details:    Incision types:  Stab incision   Drainage:  Purulent   Drainage amount:  Moderate   Wound treatment:  Wound left open   Packing materials:  None Post-procedure details:    Procedure completion:  Tolerated    Medications Ordered in the ED - No data to display                                  Medical Decision Making  Dana Campbell is here with swelling to the base of her right index finger nailbed.  This is consistent with a paronychia on exam.  No concern for felon or flexor tenosynovitis.  This is a small amount of swelling.  This is I&D at bedside successfully with a copious amount of discharge.  Will prescribe antibiotics.  Recommend warm water soaks at home.  Understands return precautions.  Patient neurovascular neuromuscular intact on exam.  Discharge.  This chart was dictated using voice recognition software.  Despite best efforts to proofread,  errors can occur which can change the documentation meaning.      Final diagnoses:  Paronychia of finger of right hand    ED Discharge Orders          Ordered    doxycycline  (VIBRAMYCIN ) 100 MG capsule  2 times daily        08/11/24 0808               Ruthe Cornet, DO 08/11/24 [phone]

## 2024-08-12 ENCOUNTER — Encounter (HOSPITAL_COMMUNITY): Payer: Self-pay | Admitting: Emergency Medicine

## 2024-08-12 ENCOUNTER — Emergency Department (HOSPITAL_COMMUNITY)
Admission: EM | Admit: 2024-08-12 | Discharge: 2024-08-12 | Disposition: A | Discharge status: Home / Self Care | Attending: Emergency Medicine | Admitting: Emergency Medicine

## 2024-08-12 DIAGNOSIS — L03011 Cellulitis of right finger: Secondary | ICD-10-CM | POA: Insufficient documentation

## 2024-08-12 MED ORDER — HYDROCODONE-ACETAMINOPHEN 5-325 MG PO TABS
1.0000 | ORAL_TABLET | Freq: Once | ORAL | Status: AC
  Administered 2024-08-12: 1 via ORAL
  Filled 2024-08-12: qty 1

## 2024-08-12 MED ORDER — LIDOCAINE HCL (PF) 1 % IJ SOLN
10.0000 mL | Freq: Once | INTRAMUSCULAR | Status: AC
  Administered 2024-08-12: 10 mL
  Filled 2024-08-12: qty 30

## 2024-08-12 NOTE — ED Triage Notes (Signed)
 Patient stated worsening redness and swelling of right index finger. Patient was seen yesterday with finger infection.

## 2024-08-12 NOTE — ED Provider Notes (Signed)
 [place] [facility]MENT AT [facility] Provider Note   CSN: [id number] Arrival date & time: 08/12/24  0410     Patient presents with: Wound Infection   Dana Campbell is a 43 y.o. female.   Patient with a history of ulcerative colitis on Skyrizi  last dose 4 weeks ago here with finger injury.  She had a hangnail for about a week and went to [facility] yesterday and had a paronychia drained.  She was prescribed doxycycline  which she has had 2 doses.  She comes in this morning with worsening pain and swelling to her right index finger.  She feels like the swelling is involving more of the finger and is still swollen and red and painful.  No further bleeding or drainage though she has been attempting to drain it at home by massaging it.  Denies fever, chills, nausea or vomiting.  No chest pain or shortness of breath.  She has not performed any warm soaks and stretches yesterday. She is not diabetic  The history is provided by the patient.       Prior to Admission medications   Medication Sig Start Date End Date Taking? Authorizing Provider  acetaminophen  (TYLENOL ) 500 MG tablet Take 1,000 mg by mouth every 8 (eight) hours as needed (pain).    [provider]  calcium carbonate (TUMS - DOSED IN MG ELEMENTAL CALCIUM) 500 MG chewable tablet Chew 1 tablet by mouth as needed for indigestion or heartburn.    [provider]  cetirizine  (ZYRTEC ) 10 MG tablet Take 10 mg by mouth at bedtime as needed for allergies.    [provider]  doxycycline  (VIBRAMYCIN ) 100 MG capsule Take 1 capsule (100 mg total) by mouth 2 (two) times daily for 5 days. 08/11/24 08/16/24  Curatolo, Adam, DO  ferrous sulfate 325 (65 FE) MG EC tablet Take 325 mg by mouth daily.    [provider]  leuprolide  (LUPRON  DEPOT, 89-MONTH,) 11.25 MG injection Inject 11.25 mg into the muscle every 3 (three) months as directed Patient taking differently: Inject 11.25 mg into the  muscle every 3 (three) months. 01/19/24     mesalamine  (APRISO ) 0.375 g 24 hr capsule Take 4 capsules (1.5 g total) by mouth every morning. Patient taking differently: Take 1.5 g by mouth every morning. 12/15/23     mesalamine  (APRISO ) 0.375 g 24 hr capsule Take 4 capsules Orally Once a day (1.5 g total)    [provider]  Multiple Vitamins-Minerals (EMERGEN-C IMMUNE PLUS PO) Take 1 capsule by mouth daily.    [provider]  ondansetron  (ZOFRAN -ODT) 8 MG disintegrating tablet Take 1 tablet (8 mg total) by mouth and allow to dissolve every 8 (eight) hours as needed for nausea. Patient taking differently: Take 8 mg by mouth every 8 (eight) hours as needed for nausea or vomiting. 10/01/23     Risankizumab -rzaa (SKYRIZI ) 180 MG/1.2ML SOCT Inject 180mg  subcutanously at week 12, then every 8 weeks Patient taking differently: Inject 180 mg into the skin every 8 (eight) weeks. 01/13/24   Jegede, Olugbemiga E, MD    Allergies: Cinnamon, Nsaids, and Protonix  [pantoprazole ]    Review of Systems  Constitutional:  Negative for activity change, appetite change and fever.  HENT:  Negative for congestion and rhinorrhea.   Respiratory:  Negative for cough, chest tightness and shortness of breath.   Cardiovascular:  Negative for chest pain.  Gastrointestinal:  Negative for abdominal pain, nausea and vomiting.  Genitourinary:  Negative for dysuria and  hematuria.  Musculoskeletal:  Negative for arthralgias and myalgias.  Skin:  Positive for wound.  Neurological:  Negative for dizziness, weakness and headaches.   all other systems are negative except as noted in the HPI and PMH.    Updated Vital Signs BP (!) 142/93   Pulse (!) 115   Temp 98.2 F (36.8 C) (Oral)   Resp 19   LMP 07/09/2024 (Approximate)   SpO2 99%   Physical Exam Vitals and nursing note reviewed.  Constitutional:      General: She is not in acute distress.    Appearance: She is well-developed.  HENT:     Head:  Normocephalic and atraumatic.     Mouth/Throat:     Pharynx: No oropharyngeal exudate.  Eyes:     Conjunctiva/sclera: Conjunctivae normal.     Pupils: Pupils are equal, round, and reactive to light.  Neck:     Comments: No meningismus. Cardiovascular:     Rate and Rhythm: Normal rate and regular rhythm.     Heart sounds: Normal heart sounds. No murmur heard. Pulmonary:     Effort: Pulmonary effort is normal. No respiratory distress.     Breath sounds: Normal breath sounds.  Abdominal:     Palpations: Abdomen is soft.     Tenderness: There is no abdominal tenderness. There is no guarding or rebound.  Musculoskeletal:        General: Swelling and tenderness present. Normal range of motion.     Cervical back: Normal range of motion and neck supple.     Comments: Swelling and tenderness to nailfold of right index finger with mild fluctuance and erythema.  Full range of motion of MCP, PIP and DIP joint.  No tenderness along flexor tendon sheath.  Skin:    General: Skin is warm.  Neurological:     Mental Status: She is alert and oriented to person, place, and time.     Cranial Nerves: No cranial nerve deficit.     Motor: No abnormal muscle tone.     Coordination: Coordination normal.     Comments:  5/5 strength throughout. CN 2-12 intact.Equal grip strength.   Psychiatric:        Behavior: Behavior normal.     (all labs ordered are listed, but only abnormal results are displayed) Labs Reviewed - No data to display  EKG: None  Radiology: No results found.   .Incision and Drainage  Date/Time: 08/12/2024 5:46 AM  Performed by: Carita Senior, MD Authorized by: Carita Senior, MD   Consent:    Consent obtained:  Verbal   Consent given by:  Patient   Risks, benefits, and alternatives were discussed: yes     Risks discussed:  Bleeding, damage to other organs, infection, incomplete drainage and pain   Alternatives discussed:  No treatment Universal protocol:     Procedure explained and questions answered to patient or proxy's satisfaction: yes     Immediately prior to procedure, a time out was called: yes     Patient identity confirmed:  Verbally with patient Location:    Indications for incision and drainage: paronychia.   Location:  Upper extremity   Upper extremity location:  Finger   Finger location:  R index finger Pre-procedure details:    Skin preparation:  Povidone-iodine Sedation:    Sedation type:  None Anesthesia:    Anesthesia method:  Nerve block   Block location:  Digital   Block needle gauge:  25 G   Block anesthetic:  Lidocaine   1% w/o epi   Block technique:  Digital   Block injection procedure:  Anatomic landmarks identified, anatomic landmarks palpated, negative aspiration for blood, introduced needle and incremental injection   Block outcome:  Anesthesia achieved Procedure type:    Complexity:  Simple Procedure details:    Ultrasound guidance: no     Needle aspiration: no     Incision types:  Single straight   Incision depth:  Subcutaneous   Wound management:  Probed and deloculated   Drainage:  Purulent   Drainage amount:  Scant   Wound treatment:  Wound left open   Packing materials:  None Post-procedure details:    Procedure completion:  Tolerated    Medications Ordered in the ED  lidocaine  (PF) (XYLOCAINE ) 1 % injection 10 mL (has no administration in time range)                                    Medical Decision Making Amount and/or Complexity of Data Reviewed Labs: ordered. Decision-making details documented in ED Course. Radiology: ordered and independent interpretation performed. Decision-making details documented in ED Course. ECG/medicine tests: ordered and independent interpretation performed. Decision-making details documented in ED Course.  Risk Prescription drug management.   Return visit for right index finger paronychia.  She is tachycardic and uncomfortable on arrival.  There is some  palpable fluctuance along nail fold that persists.  Will perform digital block and attempt further drainage.  Heart rate normalized to 95 on my exam.  Digital block performed with some purulent drainage expressed from apparent recurrent paronychia.  No evidence of flexor tenosynovitis currently.  Continue warm soaks and antibiotics.  Follow-up with hand surgery.  Return to the ED for worsening pain, fever, spreading redness, pain with movement of the finger or other concerns.    Final diagnoses:  Paronychia of finger of right hand    ED Discharge Orders     None          Terris Germano, Garnette, MD 08/12/24 [phone]

## 2024-08-12 NOTE — Discharge Instructions (Signed)
 Continue the antibiotics as prescribed.  Perform warm soaks 2-3 times daily and follow-up with a hand surgeon next week.  Return to the ED the worsening pain, fever, redness spreading up your finger or hand, pain with movement of the finger, difficulty moving wrist or elbow or any other concerns.

## 2024-08-15 ENCOUNTER — Other Ambulatory Visit: Payer: Self-pay | Admitting: Obstetrics and Gynecology

## 2024-08-21 ENCOUNTER — Other Ambulatory Visit (HOSPITAL_COMMUNITY): Payer: Self-pay

## 2024-08-22 ENCOUNTER — Other Ambulatory Visit: Payer: Self-pay

## 2024-08-22 ENCOUNTER — Encounter (INDEPENDENT_AMBULATORY_CARE_PROVIDER_SITE_OTHER): Payer: Self-pay

## 2024-08-22 NOTE — Progress Notes (Unsigned)
 Specialty Pharmacy Refill Coordination Note  Dana Campbell is a 43 y.o. female contacted today regarding refills of specialty medication(s) Risankizumab -rzaa (Skyrizi )   Patient requested (Patient-Rptd) Pickup at [facility] date: (Patient-Rptd) 08/30/24   Medication will be filled on 08/29/24.

## 2024-08-23 ENCOUNTER — Other Ambulatory Visit: Payer: Self-pay

## 2024-08-23 ENCOUNTER — Telehealth: Payer: Self-pay

## 2024-08-23 NOTE — Progress Notes (Addendum)
 PA pending

## 2024-08-23 NOTE — Telephone Encounter (Signed)
 Pharmacy Patient Advocate Encounter  Received notification from [facility] that Prior Authorization for Skyrizi  has been APPROVED from 08/23/24 to 08/22/25   PA #/Case ID/Reference #: [id number]

## 2024-08-23 NOTE — Telephone Encounter (Signed)
 Pharmacy Patient Advocate Encounter   Received notification from Patient Pharmacy that prior authorization for Skyrizi  is required/requested.   Insurance verification completed.   The patient is insured through [facility] .   Per test claim: PA required; PA submitted to above mentioned insurance via Latent Key/confirmation #/EOC [id number] Status is pending

## 2024-08-28 ENCOUNTER — Other Ambulatory Visit: Payer: Self-pay

## 2024-08-28 ENCOUNTER — Other Ambulatory Visit (HOSPITAL_COMMUNITY): Payer: Self-pay

## 2024-08-29 DIAGNOSIS — Z1231 Encounter for screening mammogram for malignant neoplasm of breast: Secondary | ICD-10-CM | POA: Diagnosis not present

## 2024-09-04 ENCOUNTER — Other Ambulatory Visit: Payer: Self-pay | Admitting: Obstetrics and Gynecology

## 2024-09-04 DIAGNOSIS — Z01419 Encounter for gynecological examination (general) (routine) without abnormal findings: Secondary | ICD-10-CM | POA: Diagnosis not present

## 2024-09-04 DIAGNOSIS — N852 Hypertrophy of uterus: Secondary | ICD-10-CM | POA: Diagnosis not present

## 2024-09-04 DIAGNOSIS — N921 Excessive and frequent menstruation with irregular cycle: Secondary | ICD-10-CM | POA: Diagnosis not present

## 2024-09-04 DIAGNOSIS — Z01818 Encounter for other preprocedural examination: Secondary | ICD-10-CM | POA: Diagnosis not present

## 2024-09-06 ENCOUNTER — Other Ambulatory Visit (HOSPITAL_COMMUNITY): Payer: Self-pay | Admitting: Obstetrics and Gynecology

## 2024-09-06 ENCOUNTER — Encounter (HOSPITAL_COMMUNITY): Payer: Self-pay | Admitting: Obstetrics and Gynecology

## 2024-09-06 DIAGNOSIS — K439 Ventral hernia without obstruction or gangrene: Secondary | ICD-10-CM

## 2024-09-06 DIAGNOSIS — N852 Hypertrophy of uterus: Secondary | ICD-10-CM

## 2024-09-07 ENCOUNTER — Other Ambulatory Visit (HOSPITAL_COMMUNITY): Payer: Self-pay

## 2024-09-07 ENCOUNTER — Ambulatory Visit (HOSPITAL_COMMUNITY)
Admission: RE | Admit: 2024-09-07 | Discharge: 2024-09-07 | Disposition: A | Discharge status: Home / Self Care | Source: Ambulatory Visit | Attending: Obstetrics and Gynecology | Admitting: Obstetrics and Gynecology

## 2024-09-07 ENCOUNTER — Ambulatory Visit (HOSPITAL_COMMUNITY)
Admission: RE | Admit: 2024-09-07 | Discharge: 2024-09-07 | Attending: Obstetrics and Gynecology | Admitting: Obstetrics and Gynecology

## 2024-09-07 DIAGNOSIS — N852 Hypertrophy of uterus: Secondary | ICD-10-CM | POA: Insufficient documentation

## 2024-09-07 DIAGNOSIS — R188 Other ascites: Secondary | ICD-10-CM | POA: Diagnosis not present

## 2024-09-07 DIAGNOSIS — K439 Ventral hernia without obstruction or gangrene: Secondary | ICD-10-CM | POA: Insufficient documentation

## 2024-09-07 DIAGNOSIS — D259 Leiomyoma of uterus, unspecified: Secondary | ICD-10-CM | POA: Diagnosis not present

## 2024-09-07 DIAGNOSIS — K429 Umbilical hernia without obstruction or gangrene: Secondary | ICD-10-CM | POA: Diagnosis not present

## 2024-09-07 MED ORDER — GADOBUTROL 1 MMOL/ML IV SOLN
10.0000 mL | Freq: Once | INTRAVENOUS | Status: AC | PRN
Start: 2024-09-07 — End: 2024-09-07
  Administered 2024-09-07: 10 mL via INTRAVENOUS

## 2024-09-21 ENCOUNTER — Other Ambulatory Visit: Payer: Self-pay

## 2024-10-04 ENCOUNTER — Encounter (HOSPITAL_COMMUNITY): Payer: Self-pay | Admitting: Obstetrics and Gynecology

## 2024-10-04 NOTE — Progress Notes (Signed)
 Spoke w/ via phone for pre-op interview--- pt Lab needs dos----  upt       Lab results------ lab appt 10-06-2024 @ 1130 getting CBC/ T&S COVID test -----patient states asymptomatic no test needed Arrive at ------- 0900 on 10-09-2024 NPO after MN w/ exception sips of water w/ meds Pre-Surgery Ensure or G2: n/a  Med rec completed Medications to take morning of surgery ----- mesalamine / if needed zofran  Diabetic medication ----- n/a  GLP1 agonist last dose: n/a GLP1 instructions:  Patient instructed no nail polish to be worn day of surgery Patient instructed to bring photo id and insurance card day of surgery Patient aware to have Driver (ride ) / caregiver    for 24 hours after surgery - spouse, erica ines Patient Special Instructions ----- will pick up soap and written instructions at lab appt Pre-Op special Instructions ----- n/a  Patient verbalized understanding of instructions that were given at this phone interview. Patient denies chest pain, sob, fever, cough at the interview.

## 2024-10-04 NOTE — Pre-Procedure Instructions (Signed)
 Surgical Instructions  Your procedure is scheduled on :   Monday,  10-09-2024 Report to [facility] Main Entrance A at 9:00 AM, then check in the Admitting office. Any questions or running late day of surgery :  call [phone]  Questions prior to your surgery day:  call [phone], Monday -- Friday 8am - 4pm. If you experience any cold or flu symptoms such as cough, fever, chills, shortness of breath, etc. between now and you scheduled surgery, please notify your surgeon office.   Remember: Do not eat any food after midnight the night before surgery. This includes No water,  candy,  gum, and mints.  Take these medicines the morning of surgery with A SIPS OF WATER:   Mesalamine    May take these medicines IF NEEDED:   Ondansetron  (zofran )  One week prior to surgery, STOP taking any Aspirin (unless otherwise instructed by your surgeon) Aleve, Naproxen, ibuprofen , Motrin , Advil , Goody's, BC's, all herbal medications/ supplements, fish oil, and non-prescription vitamins.  Do NOT Smoke (tobacco/ vaping) and Do Not drink alcohol for 24 hours prior to your procedure.  For those patients that use a CPAP.  Please bring your CPAP/ mask/ tubing with them day of surgery . Anesthesia may ask recovery room nurse to use and if you stay the night you be asked to use it.  You will be asked to removed any contacts, glasses, piercing's, hearing aid's, dentures/ partials prior to surgery.  Please bring cases/ container/ solution/ etc., for them day of surgery.   Patients discharged the day of surgery will NOT be allowed to drive home.  You must have responsible driver and caregiver to stay at home with you the next 24 hours.  SURGICAL WAITING ROOM VISITATION Patients may have no more than 2 support people in the waiting area - if more than 2 , these visitors may rotate.  Pre-op nurse will coordinate an appropriate time for 1 Adult support person, who may not rotate, to accompany patient in pre-op.   Aware some patients may have certain circumstances, speak to pre-op nurse day of surgery.  Children under the age 23 must have an adult with them who is not the patient and must remain in the main waiting area with an adult.  If the patient needs to stay at the hospital during part of their recovery, the visitor guidelines for inpatient rooms apply.  Please refer to the [facility] website for the visitor guidelines for any additional information.  If you received a COVID test during your pre-op visit it is requested that you wear a mask when out in public, stay away from anyone that may not be feeling well and notify your surgeon if you develop symptoms.  If you have been in contact with anyone that has tested positive in the past 10 days notify your surgeon.     [place] [facility] - Preparing for Surgery  Before surgery, you can play an important role. Because skin is not sterile, it needs to be as free of germs as possible. You can reduce the number of germs on your skin by washing with CHG (chlorhexidine gluconate) soap before surgery. CHG is an antiseptic cleaner which kills germs and bonds with the skin to continue killing germs even after washing. Oral hygiene is also important in reducing the risk of infection. Remember to brush your teeth with your regular toothpaste the morning of surgery.  Please DO NOT use if you have an allergy to CHG or antibacterial soaps. If your  skin becomes reddened/irritated stop using the CHG and inform your Pre-op nurse day of surgery.  DO NOT shave (including legs and genital area) for at least 48 hours prior to your CHG shower.   Please follow these instructions carefully:  Shower with CHG soap the night before surgery. If you choose to wash your hair, wash your hair first as usual with your normal shampoo. After you shampoo, rinse your hair and body thoroughly to remove the shampoo. Use CHG as you would any other liquid soap. You can apply CHG directly to the  skin and wash gently with a clean washcloth or shower sponge. Apply the CHG soap to your body ONLY FROM THE NECK DOWN. Do not use on open wounds or open sores. Avoid contact with your eyes, ears, mouth, and genitals (private parts). Wash genitals (private parts) with your normal soap. Wash thoroughly, paying special attention to the area where your surgery will be performed. Thoroughly rinse your body with warm water from the neck down. DO NOT shower/wash with your normal soap after using and rinsing off the CHG soap. DO NOT use lotions, oils, etc., after showering with CHG. Pat yourself dry with a clean towel. Wear clean pajamas. Place clean sheets on your bed the night of your CHG shower and do not sleep with pets.  Day of Surgery  DO NOT Apply any lotions,  powder,  oils,  deodorants (may use underarm deodorant),  cologne/  perfumes  or makeup Do Not wear jewelry /  piercing's/  metal/  permanent jewelry must be removed prior to arrival day of surgery. (No plastic piercing) Do Not wear nail polish,  gel polish,  artificial nails, or any other type of covering on natural finger nails (toe nails are okay) Remember to brush your teeth and rinse mouth out. Put on clean / comfortable clothes. [facility] is not responsible for valuables/ personal belongings

## 2024-10-06 ENCOUNTER — Other Ambulatory Visit (HOSPITAL_COMMUNITY): Payer: Self-pay

## 2024-10-06 ENCOUNTER — Encounter (HOSPITAL_COMMUNITY)
Admission: RE | Admit: 2024-10-06 | Discharge: 2024-10-06 | Disposition: A | Discharge status: Home / Self Care | Source: Ambulatory Visit | Attending: Obstetrics and Gynecology | Admitting: Obstetrics and Gynecology

## 2024-10-06 DIAGNOSIS — Z01812 Encounter for preprocedural laboratory examination: Secondary | ICD-10-CM | POA: Insufficient documentation

## 2024-10-06 LAB — CBC
HCT: 29.7 % — ABNORMAL LOW (ref 36.0–46.0)
Hemoglobin: 8.8 g/dL — ABNORMAL LOW (ref 12.0–15.0)
MCH: 23 pg — ABNORMAL LOW (ref 26.0–34.0)
MCHC: 29.6 g/dL — ABNORMAL LOW (ref 30.0–36.0)
MCV: 77.5 fL — ABNORMAL LOW (ref 80.0–100.0)
Platelets: 338 K/uL (ref 150–400)
RBC: 3.83 MIL/uL — ABNORMAL LOW (ref 3.87–5.11)
RDW: 19.9 % — ABNORMAL HIGH (ref 11.5–15.5)
WBC: 6 K/uL (ref 4.0–10.5)
nRBC: 0 % (ref 0.0–0.2)

## 2024-10-08 NOTE — Anesthesia Preprocedure Evaluation (Signed)
 Anesthesia Evaluation  Patient identified by MRN, date of birth, ID band Patient awake    Reviewed: Allergy & Precautions, NPO status , Patient's Chart, lab work & pertinent test results  History of Anesthesia Complications Negative for: history of anesthetic complications  Airway Mallampati: III  TM Distance: >3 FB Neck ROM: Full  Mouth opening: Limited Mouth Opening Comment: Previous grade I view with MAC 3, easy mask Dental  (+) Dental Advisory Given   Pulmonary neg pulmonary ROS   Pulmonary exam normal breath sounds clear to auscultation       Cardiovascular negative cardio ROS  Rhythm:Regular Rate:Normal     Neuro/Psych negative neurological ROS     GI/Hepatic Neg liver ROS,GERD  ,,Ulcerative pancolitis    Endo/Other  neg diabetes  Class 3 obesity  Renal/GU negative Renal ROS     Musculoskeletal   Abdominal  (+) + obese  Peds  Hematology  (+) Blood dyscrasia, anemia Lab Results      Component                Value               Date                      WBC                      6.0                 10/06/2024                HGB                      8.8 (L)             10/06/2024                HCT                      29.7 (L)            10/06/2024                MCV                      77.5 (L)            10/06/2024                PLT                      338                 10/06/2024              Anesthesia Other Findings   Reproductive/Obstetrics Leiomyoma                               Anesthesia Physical Anesthesia Plan  ASA: 3  Anesthesia Plan: General   Post-op Pain Management: Tylenol  PO (pre-op)* and Ketamine IV*   Induction: Intravenous  PONV Risk Score and Plan: 3 and Ondansetron , Dexamethasone, Midazolam , Scopolamine patch - Pre-op and Treatment may vary due to age or medical condition  Airway Management Planned: Oral ETT  Additional Equipment:   Intra-op  Plan:   Post-operative Plan: Extubation in OR  Informed Consent: I have reviewed the  patients History and Physical, chart, labs and discussed the procedure including the risks, benefits and alternatives for the proposed anesthesia with the patient or authorized representative who has indicated his/her understanding and acceptance.     Dental advisory given  Plan Discussed with: CRNA and Anesthesiologist  Anesthesia Plan Comments: (Risks of general anesthesia discussed including, but not limited to, sore throat, hoarse voice, chipped/damaged teeth, injury to vocal cords, nausea and vomiting, allergic reactions, lung infection, heart attack, stroke, and death. All questions answered. )         Anesthesia Quick Evaluation

## 2024-10-09 ENCOUNTER — Encounter (HOSPITAL_COMMUNITY)
Admission: RE | Disposition: A | Discharge status: Home / Self Care | Payer: Self-pay | Source: Home / Self Care | Attending: Obstetrics and Gynecology

## 2024-10-09 ENCOUNTER — Inpatient Hospital Stay (HOSPITAL_COMMUNITY)
Admission: RE | Admit: 2024-10-09 | Discharge: 2024-10-11 | DRG: 742 | Disposition: A | Attending: Obstetrics and Gynecology | Admitting: Obstetrics and Gynecology

## 2024-10-09 ENCOUNTER — Encounter (HOSPITAL_COMMUNITY): Payer: Self-pay | Admitting: Obstetrics and Gynecology

## 2024-10-09 ENCOUNTER — Other Ambulatory Visit: Payer: Self-pay

## 2024-10-09 ENCOUNTER — Other Ambulatory Visit: Payer: Self-pay | Admitting: Obstetrics and Gynecology

## 2024-10-09 ENCOUNTER — Inpatient Hospital Stay (HOSPITAL_COMMUNITY): Payer: Self-pay | Admitting: Anesthesiology

## 2024-10-09 DIAGNOSIS — D250 Submucous leiomyoma of uterus: Secondary | ICD-10-CM | POA: Diagnosis not present

## 2024-10-09 DIAGNOSIS — Z01818 Encounter for other preprocedural examination: Principal | ICD-10-CM

## 2024-10-09 DIAGNOSIS — D500 Iron deficiency anemia secondary to blood loss (chronic): Secondary | ICD-10-CM | POA: Diagnosis present

## 2024-10-09 DIAGNOSIS — Z90710 Acquired absence of both cervix and uterus: Secondary | ICD-10-CM | POA: Diagnosis present

## 2024-10-09 DIAGNOSIS — N921 Excessive and frequent menstruation with irregular cycle: Secondary | ICD-10-CM | POA: Diagnosis not present

## 2024-10-09 DIAGNOSIS — Z6841 Body Mass Index (BMI) 40.0 and over, adult: Secondary | ICD-10-CM

## 2024-10-09 DIAGNOSIS — Z8249 Family history of ischemic heart disease and other diseases of the circulatory system: Secondary | ICD-10-CM | POA: Diagnosis not present

## 2024-10-09 DIAGNOSIS — I10 Essential (primary) hypertension: Secondary | ICD-10-CM | POA: Diagnosis not present

## 2024-10-09 DIAGNOSIS — K5100 Ulcerative (chronic) pancolitis without complications: Secondary | ICD-10-CM | POA: Diagnosis present

## 2024-10-09 DIAGNOSIS — N8312 Corpus luteum cyst of left ovary: Secondary | ICD-10-CM | POA: Diagnosis present

## 2024-10-09 DIAGNOSIS — N858 Other specified noninflammatory disorders of uterus: Secondary | ICD-10-CM | POA: Diagnosis not present

## 2024-10-09 DIAGNOSIS — D251 Intramural leiomyoma of uterus: Principal | ICD-10-CM | POA: Diagnosis present

## 2024-10-09 DIAGNOSIS — E66813 Obesity, class 3: Secondary | ICD-10-CM | POA: Diagnosis present

## 2024-10-09 DIAGNOSIS — Z833 Family history of diabetes mellitus: Secondary | ICD-10-CM | POA: Diagnosis not present

## 2024-10-09 DIAGNOSIS — K219 Gastro-esophageal reflux disease without esophagitis: Secondary | ICD-10-CM | POA: Diagnosis present

## 2024-10-09 DIAGNOSIS — D259 Leiomyoma of uterus, unspecified: Secondary | ICD-10-CM | POA: Diagnosis present

## 2024-10-09 HISTORY — PX: HYSTERECTOMY ABDOMINAL WITH SALPINGECTOMY: SHX6725

## 2024-10-09 HISTORY — PX: APPLICATION OF CELL SAVER: SHX7529

## 2024-10-09 HISTORY — PX: CYSTOSCOPY: SHX5120

## 2024-10-09 HISTORY — DX: Iron deficiency anemia secondary to blood loss (chronic): D50.0

## 2024-10-09 LAB — POCT PREGNANCY, URINE: Preg Test, Ur: NEGATIVE

## 2024-10-09 LAB — PREPARE RBC (CROSSMATCH)

## 2024-10-09 SURGERY — HYSTERECTOMY, TOTAL, ABDOMINAL, WITH SALPINGECTOMY
Anesthesia: General

## 2024-10-09 MED ORDER — FENTANYL CITRATE (PF) 250 MCG/5ML IJ SOLN
INTRAMUSCULAR | Status: DC | PRN
  Administered 2024-10-09: 50 ug via INTRAVENOUS
  Administered 2024-10-09: 100 ug via INTRAVENOUS
  Administered 2024-10-09: 50 ug via INTRAVENOUS

## 2024-10-09 MED ORDER — FENTANYL CITRATE (PF) 250 MCG/5ML IJ SOLN
INTRAMUSCULAR | Status: AC
  Filled 2024-10-09: qty 5

## 2024-10-09 MED ORDER — ENOXAPARIN SODIUM 60 MG/0.6ML IJ SOSY
60.0000 mg | PREFILLED_SYRINGE | INTRAMUSCULAR | Status: DC
  Administered 2024-10-10: 60 mg via SUBCUTANEOUS
  Filled 2024-10-09: qty 0.6

## 2024-10-09 MED ORDER — SODIUM CHLORIDE (PF) 0.9 % IJ SOLN
INTRAMUSCULAR | Status: AC
  Filled 2024-10-09: qty 100

## 2024-10-09 MED ORDER — SODIUM CHLORIDE 0.9% FLUSH
9.0000 mL | INTRAVENOUS | Status: DC | PRN
Start: 2024-10-09 — End: 2024-10-11

## 2024-10-09 MED ORDER — HYDROMORPHONE 1 MG/ML IV SOLN
INTRAVENOUS | Status: AC
  Administered 2024-10-09: 2.6 mg via INTRAVENOUS
  Administered 2024-10-09: 30 mg via INTRAVENOUS
  Administered 2024-10-10: 0.3 mg via INTRAVENOUS
  Administered 2024-10-10 (×2): 0.9 mg via INTRAVENOUS
  Filled 2024-10-09: qty 30

## 2024-10-09 MED ORDER — LIDOCAINE 2% (20 MG/ML) 5 ML SYRINGE
INTRAMUSCULAR | Status: DC | PRN
Start: 2024-10-09 — End: 2024-10-09
  Administered 2024-10-09: 100 mg via INTRAVENOUS

## 2024-10-09 MED ORDER — MIDAZOLAM HCL 2 MG/2ML IJ SOLN
INTRAMUSCULAR | Status: AC
  Filled 2024-10-09: qty 2

## 2024-10-09 MED ORDER — ORAL CARE MOUTH RINSE: 15.0000 mL | Freq: Once | OROMUCOSAL | Status: AC

## 2024-10-09 MED ORDER — RISANKIZUMAB-RZAA 180 MG/1.2ML ~~LOC~~ SOCT: 180.0000 mg | SUBCUTANEOUS | Status: DC

## 2024-10-09 MED ORDER — VASOPRESSIN 20 UNIT/ML IV SOLN
INTRAVENOUS | Status: AC
  Filled 2024-10-09: qty 1

## 2024-10-09 MED ORDER — OXYCODONE HCL 5 MG/5ML PO SOLN: 5.0000 mg | Freq: Once | ORAL | Status: DC | PRN

## 2024-10-09 MED ORDER — HYDROMORPHONE HCL 1 MG/ML IJ SOLN
INTRAMUSCULAR | Status: AC
  Filled 2024-10-09: qty 0.5

## 2024-10-09 MED ORDER — HYDROMORPHONE HCL 1 MG/ML IJ SOLN: 0.2500 mg | INTRAMUSCULAR | Status: DC | PRN

## 2024-10-09 MED ORDER — SCOPOLAMINE 1 MG/3DAYS TD PT72
MEDICATED_PATCH | TRANSDERMAL | Status: AC
  Filled 2024-10-09: qty 1

## 2024-10-09 MED ORDER — CHLORHEXIDINE GLUCONATE 0.12 % MT SOLN
OROMUCOSAL | Status: AC
  Filled 2024-10-09: qty 15

## 2024-10-09 MED ORDER — LACTATED RINGERS IV SOLN: INTRAVENOUS | Status: DC

## 2024-10-09 MED ORDER — BUPIVACAINE HCL (PF) 0.25 % IJ SOLN
INTRAMUSCULAR | Status: DC | PRN
  Administered 2024-10-09: 20 mL

## 2024-10-09 MED ORDER — LACTATED RINGERS IV SOLN: INTRAVENOUS | Status: AC

## 2024-10-09 MED ORDER — PROPOFOL 1000 MG/100ML IV EMUL
INTRAVENOUS | Status: AC
  Filled 2024-10-09: qty 100

## 2024-10-09 MED ORDER — OXYCODONE HCL 5 MG PO TABS
5.0000 mg | ORAL_TABLET | ORAL | Status: DC | PRN
  Administered 2024-10-10 – 2024-10-11 (×3): 5 mg via ORAL
  Filled 2024-10-09 (×2): qty 1
  Filled 2024-10-09: qty 2

## 2024-10-09 MED ORDER — PROPOFOL 10 MG/ML IV BOLUS
INTRAVENOUS | Status: AC
Start: 2024-10-09 — End: 2024-10-09
  Filled 2024-10-09: qty 20

## 2024-10-09 MED ORDER — HEMOSTATIC AGENTS (NO CHARGE) OPTIME
TOPICAL | Status: DC | PRN
  Administered 2024-10-09: 1 via TOPICAL

## 2024-10-09 MED ORDER — DEXMEDETOMIDINE HCL IN NACL 80 MCG/20ML IV SOLN
INTRAVENOUS | Status: DC | PRN
  Administered 2024-10-09: 10 ug via INTRAVENOUS

## 2024-10-09 MED ORDER — ONDANSETRON HCL 4 MG/2ML IJ SOLN: 4.0000 mg | Freq: Four times a day (QID) | INTRAMUSCULAR | Status: DC | PRN

## 2024-10-09 MED ORDER — GABAPENTIN 300 MG PO CAPS: 300.0000 mg | ORAL_CAPSULE | Freq: Three times a day (TID) | ORAL | Status: DC | PRN

## 2024-10-09 MED ORDER — ACETAMINOPHEN 500 MG PO TABS
1000.0000 mg | ORAL_TABLET | Freq: Four times a day (QID) | ORAL | Status: DC
  Administered 2024-10-10 – 2024-10-11 (×5): 1000 mg via ORAL
  Filled 2024-10-09 (×5): qty 2

## 2024-10-09 MED ORDER — SODIUM CHLORIDE 0.9 % IR SOLN
Status: DC | PRN
  Administered 2024-10-09: 1000 mL via INTRAVESICAL

## 2024-10-09 MED ORDER — DEXAMETHASONE SOD PHOSPHATE PF 10 MG/ML IJ SOLN
INTRAMUSCULAR | Status: DC | PRN
Start: 2024-10-09 — End: 2024-10-09
  Administered 2024-10-09: 10 mg via INTRAVENOUS

## 2024-10-09 MED ORDER — HYDROMORPHONE HCL 1 MG/ML IJ SOLN
INTRAMUSCULAR | Status: DC | PRN
  Administered 2024-10-09 (×2): .5 mg via INTRAVENOUS

## 2024-10-09 MED ORDER — FENTANYL CITRATE (PF) 100 MCG/2ML IJ SOLN
INTRAMUSCULAR | Status: AC
  Filled 2024-10-09: qty 2

## 2024-10-09 MED ORDER — DIPHENHYDRAMINE HCL 12.5 MG/5ML PO ELIX: 12.5000 mg | ORAL_SOLUTION | Freq: Four times a day (QID) | ORAL | Status: DC | PRN

## 2024-10-09 MED ORDER — CEFAZOLIN SODIUM-DEXTROSE 2-4 GM/100ML-% IV SOLN
INTRAVENOUS | Status: AC
  Filled 2024-10-09: qty 100

## 2024-10-09 MED ORDER — MESALAMINE ER 0.375 G PO CP24
1.5000 g | ORAL_CAPSULE | Freq: Every morning | ORAL | Status: DC
  Administered 2024-10-10: 1.5 g via ORAL

## 2024-10-09 MED ORDER — SCOPOLAMINE 1 MG/3DAYS TD PT72
1.0000 | MEDICATED_PATCH | TRANSDERMAL | Status: DC
  Administered 2024-10-09: 1 mg via TRANSDERMAL

## 2024-10-09 MED ORDER — 0.9 % SODIUM CHLORIDE (POUR BTL) OPTIME
TOPICAL | Status: DC | PRN
  Administered 2024-10-09 (×2): 1000 mL

## 2024-10-09 MED ORDER — LACTATED RINGERS IV SOLN: INTRAVENOUS | Status: DC | PRN

## 2024-10-09 MED ORDER — ACETAMINOPHEN 500 MG PO TABS
1000.0000 mg | ORAL_TABLET | Freq: Once | ORAL | Status: AC
  Administered 2024-10-09: 1000 mg via ORAL

## 2024-10-09 MED ORDER — AMISULPRIDE (ANTIEMETIC) 5 MG/2ML IV SOLN: 10.0000 mg | Freq: Once | INTRAVENOUS | Status: DC | PRN

## 2024-10-09 MED ORDER — ROCURONIUM BROMIDE 10 MG/ML (PF) SYRINGE
PREFILLED_SYRINGE | INTRAVENOUS | Status: DC | PRN
  Administered 2024-10-09: 20 mg via INTRAVENOUS
  Administered 2024-10-09: 60 mg via INTRAVENOUS

## 2024-10-09 MED ORDER — KETAMINE HCL 50 MG/5ML IJ SOSY
PREFILLED_SYRINGE | INTRAMUSCULAR | Status: AC
Start: 2024-10-09 — End: 2024-10-09
  Filled 2024-10-09: qty 5

## 2024-10-09 MED ORDER — SODIUM CHLORIDE 0.9% IV SOLUTION: Freq: Once | INTRAVENOUS | Status: DC

## 2024-10-09 MED ORDER — DIPHENHYDRAMINE HCL 50 MG/ML IJ SOLN: 12.5000 mg | Freq: Four times a day (QID) | INTRAMUSCULAR | Status: DC | PRN

## 2024-10-09 MED ORDER — CEFAZOLIN SODIUM-DEXTROSE 2-3 GM-%(50ML) IV SOLR
INTRAVENOUS | Status: DC | PRN
  Administered 2024-10-09: 2 g via INTRAVENOUS

## 2024-10-09 MED ORDER — NALOXONE HCL 0.4 MG/ML IJ SOLN: 0.4000 mg | INTRAMUSCULAR | Status: DC | PRN

## 2024-10-09 MED ORDER — BUPIVACAINE HCL (PF) 0.25 % IJ SOLN
INTRAMUSCULAR | Status: AC
Start: 2024-10-09 — End: 2024-10-09
  Filled 2024-10-09: qty 30

## 2024-10-09 MED ORDER — ONDANSETRON HCL 4 MG/2ML IJ SOLN
INTRAMUSCULAR | Status: DC | PRN
  Administered 2024-10-09: 4 mg via INTRAVENOUS

## 2024-10-09 MED ORDER — MIDAZOLAM HCL (PF) 2 MG/2ML IJ SOLN
INTRAMUSCULAR | Status: DC | PRN
Start: 2024-10-09 — End: 2024-10-09
  Administered 2024-10-09: 2 mg via INTRAVENOUS

## 2024-10-09 MED ORDER — SUGAMMADEX SODIUM 200 MG/2ML IV SOLN
INTRAVENOUS | Status: DC | PRN
Start: 2024-10-09 — End: 2024-10-09
  Administered 2024-10-09: 200 mg via INTRAVENOUS

## 2024-10-09 MED ORDER — PHENYLEPHRINE HCL-NACL 20-0.9 MG/250ML-% IV SOLN
INTRAVENOUS | Status: DC | PRN
  Administered 2024-10-09: 20 ug/min via INTRAVENOUS

## 2024-10-09 MED ORDER — CHLORHEXIDINE GLUCONATE 0.12 % MT SOLN
15.0000 mL | Freq: Once | OROMUCOSAL | Status: AC
  Administered 2024-10-09: 15 mL via OROMUCOSAL

## 2024-10-09 MED ORDER — ACETAMINOPHEN 500 MG PO TABS
ORAL_TABLET | ORAL | Status: AC
  Filled 2024-10-09: qty 2

## 2024-10-09 MED ORDER — PROPOFOL 10 MG/ML IV BOLUS
INTRAVENOUS | Status: DC | PRN
Start: 2024-10-09 — End: 2024-10-09
  Administered 2024-10-09: 200 mg via INTRAVENOUS

## 2024-10-09 MED ORDER — MIDAZOLAM HCL 2 MG/2ML IJ SOLN
INTRAMUSCULAR | Status: AC
Start: 2024-10-09 — End: 2024-10-09
  Filled 2024-10-09: qty 2

## 2024-10-09 MED ORDER — STERILE WATER FOR IRRIGATION IR SOLN
Status: DC | PRN
  Administered 2024-10-09: 1000 mL

## 2024-10-09 MED ORDER — KETAMINE HCL 10 MG/ML IJ SOLN
INTRAMUSCULAR | Status: DC | PRN
  Administered 2024-10-09 (×2): 10 mg via INTRAVENOUS

## 2024-10-09 MED ORDER — OXYCODONE HCL 5 MG PO TABS: 5.0000 mg | ORAL_TABLET | Freq: Once | ORAL | Status: DC | PRN

## 2024-10-09 SURGICAL SUPPLY — 52 items
BAG COUNTER SPONGE SURGICOUNT (BAG) ×3 IMPLANT
BARRIER ADHS 3X4 INTERCEED (GAUZE/BANDAGES/DRESSINGS) IMPLANT
BENZOIN TINCTURE PRP APPL 2/3 (GAUZE/BANDAGES/DRESSINGS) ×3 IMPLANT
CLSR STERI-STRIP ANTIMIC 1/2X4 (GAUZE/BANDAGES/DRESSINGS) IMPLANT
COVER MAYO STAND STRL (DRAPES) ×3 IMPLANT
DRAPE SHEET LG 3/4 BI-LAMINATE (DRAPES) IMPLANT
DRAPE WARM FLUID 44X44 (DRAPES) IMPLANT
DRSG OPSITE POSTOP 4X10 (GAUZE/BANDAGES/DRESSINGS) ×3 IMPLANT
DRSG OPSITE POSTOP 4X12 (GAUZE/BANDAGES/DRESSINGS) IMPLANT
DURAPREP 26ML APPLICATOR (WOUND CARE) ×3 IMPLANT
GAUZE 4X4 16PLY ~~LOC~~+RFID DBL (SPONGE) ×6 IMPLANT
GAUZE PAD ABD 8X10 STRL (GAUZE/BANDAGES/DRESSINGS) IMPLANT
GLOVE BIO SURGEON STRL SZ7.5 (GLOVE) ×3 IMPLANT
GLOVE BIOGEL PI IND STRL 7.0 (GLOVE) IMPLANT
GLOVE BIOGEL PI IND STRL 7.5 (GLOVE) ×3 IMPLANT
GOWN STRL REUS W/ TWL LRG LVL3 (GOWN DISPOSABLE) ×9 IMPLANT
HEMOSTAT ARISTA ABSORB 3G PWDR (HEMOSTASIS) IMPLANT
HEMOSTAT SURGICEL 4X8 (HEMOSTASIS) IMPLANT
HIBICLENS CHG 4% 4OZ BTL (MISCELLANEOUS) ×6 IMPLANT
IV 0.9% NACL 1000 ML (IV SOLUTION) IMPLANT
KIT TURNOVER KIT B (KITS) ×3 IMPLANT
LEGGING LITHOTOMY PAIR STRL (DRAPES) IMPLANT
NDL HYPO 22X1.5 SAFETY MO (MISCELLANEOUS) IMPLANT
NEEDLE HYPO 22X1.5 SAFETY MO (MISCELLANEOUS) ×2 IMPLANT
PACK ABDOMINAL GYN (CUSTOM PROCEDURE TRAY) ×3 IMPLANT
PAD ARMBOARD POSITIONER FOAM (MISCELLANEOUS) ×3 IMPLANT
PAD OB MATERNITY 11 LF (PERSONAL CARE ITEMS) ×3 IMPLANT
POWDER SURGICEL 3.0 GRAM (HEMOSTASIS) IMPLANT
RTRCTR C-SECT PINK 25CM LRG (MISCELLANEOUS) IMPLANT
SET CYSTO IRRIGATION (SET/KITS/TRAYS/PACK) IMPLANT
SHEET LAVH (DRAPES) ×3 IMPLANT
SOLN 0.9% NACL POUR BTL 1000ML (IV SOLUTION) ×3 IMPLANT
SOLN STERILE WATER BTL 1000 ML (IV SOLUTION) IMPLANT
SOUND UTERINE 9.75 DISP STRL (MISCELLANEOUS) IMPLANT
SPONGE T-LAP 18X18 ~~LOC~~+RFID (SPONGE) IMPLANT
STRIP CLOSURE SKIN 1/2X4 (GAUZE/BANDAGES/DRESSINGS) ×3 IMPLANT
SUT CHROMIC 2 0 CT 1 (SUTURE) ×3 IMPLANT
SUT CHROMIC 2 0 SH (SUTURE) IMPLANT
SUT MNCRL AB 3-0 PS2 27 (SUTURE) ×3 IMPLANT
SUT MON AB 3-0 SH27 (SUTURE) IMPLANT
SUT PDS AB 1 CTX 36 (SUTURE) IMPLANT
SUT PLAIN ABS 2-0 CT1 27XMFL (SUTURE) IMPLANT
SUT VIC AB 0 CT1 18XCR BRD8 (SUTURE) ×6 IMPLANT
SUT VIC AB 0 CT1 27XBRD ANBCTR (SUTURE) ×6 IMPLANT
SUT VIC AB 3-0 SH 27X BRD (SUTURE) IMPLANT
SUT VICRYL 0 TIES 12 18 (SUTURE) ×3 IMPLANT
SYR CONTROL 10ML LL (SYRINGE) ×3 IMPLANT
TAPE PAPER 3X10 WHT MICROPORE (GAUZE/BANDAGES/DRESSINGS) IMPLANT
TOWEL GREEN STERILE FF (TOWEL DISPOSABLE) ×6 IMPLANT
TRAY FOLEY W/BAG SLVR 14FR (SET/KITS/TRAYS/PACK) ×3 IMPLANT
TUBING ANTICOAG CELL SAVER (IV SETS) IMPLANT
YANKAUER SUCT BULB TIP NO VENT (SUCTIONS) IMPLANT

## 2024-10-09 NOTE — Transfer of Care (Signed)
 Immediate Anesthesia Transfer of Care Note  Patient: Dana Campbell  Procedure(s) Performed: HYSTERECTOMY, TOTAL, ABDOMINAL, WITH BILATERAL SALPINGECTOMY APPLICATION OF CELL SAVER CYSTOSCOPY  Patient Location: PACU  Anesthesia Type:General  Level of Consciousness: drowsy and responds to stimulation  Airway & Oxygen Therapy: Patient Spontanous Breathing  Post-op Assessment: Report given to RN and Post -op Vital signs reviewed and stable  Post vital signs: Reviewed and stable  Last Vitals:  Vitals Value Taken Time  BP 139/94 10/09/24 15:12  Temp    Pulse 96 10/09/24 15:14  Resp 16 10/09/24 15:14  SpO2 96 % 10/09/24 15:14  Vitals shown include unfiled device data.  Last Pain:  Vitals:   10/09/24 0928  TempSrc: Oral  PainSc: 0-No pain      Patients Stated Pain Goal: 4 (10/09/24 9071)  Complications: No notable events documented.

## 2024-10-09 NOTE — Anesthesia Procedure Notes (Signed)
 Procedure Name: Intubation Date/Time: 10/09/2024 11:41 AM  Performed by: Scherrie Mast, CRNAPre-anesthesia Checklist: Patient identified, Emergency Drugs available, Suction available and Patient being monitored Patient Re-evaluated:Patient Re-evaluated prior to induction Oxygen Delivery Method: Circle System Utilized Preoxygenation: Pre-oxygenation with 100% oxygen Induction Type: IV induction Ventilation: Mask ventilation without difficulty Laryngoscope Size: Glidescope, Mac and 3 Grade View: Grade I Tube type: Oral Tube size: 7.0 mm Number of attempts: 1 Airway Equipment and Method: Stylet and Oral airway Placement Confirmation: ETT inserted through vocal cords under direct vision, positive ETCO2 and breath sounds checked- equal and bilateral Secured at: 22 cm Tube secured with: Tape Dental Injury: Teeth and Oropharynx as per pre-operative assessment

## 2024-10-09 NOTE — H&P (Addendum)
 Dana Campbell is an 43 y.o. female. Pt presenting for TAH/BS d/t markedly enlarged uterus with symptomatic fibroids/menometrorrhagia.  An MRI 09/04/24 did not show features suggestive of leiomyosarcoma.  EMBx was unable to be done d/t acutely displaced cervix anteriorly.  Pt does not have any children and does not desire future fertility.  She has used depot lupron  and does not desire hormonal mgmt.  Pertinent Gynecological History: Menses: heavy and prolonged  Last mammogram: birad 2 with cat C dense breast Date: 08/30/2024 Last pap: normal Date: 08/2020 OB History: G0, P0   Menstrual History:  No LMP recorded.    Past Medical History:  Diagnosis Date   GERD (gastroesophageal reflux disease)    Hemorrhoids    History of GI bleed 06/20/2022   ED/ admission in epic;  GIB w/ symptomatic anemia, Hg 6.7 transfused x2 PRBC s/p EGD/ Colonoscopy with bx's, gastritis w/ hemorrage;  dx ulcerative pancolitis   Iron  deficiency anemia due to chronic blood loss    Seasonal allergies    Ulcerative pancolitis (HCC) 05/2022   pt stated stress causes flare-ups (last flare-up 06/ 2025 resolved)  GI--- dr dianna;  dx 07/ 2023 in setting GIB w/ symptomatic anemia;  treated w/ mesalamine  and Skyrizi    Umbilical hernia without obstruction and without gangrene    Uterine fibroid    Wears contact lenses     Past Surgical History:  Procedure Laterality Date   BIOPSY  06/22/2022   Procedure: BIOPSY;  Surgeon: Dianna Specking, MD;  Location: [facility];  Service: Gastroenterology;;  EGD and COLON   COLONOSCOPY N/A 06/22/2022   Procedure: COLONOSCOPY;  Surgeon: Dianna Specking, MD;  Location: [facility];  Service: Gastroenterology;  Laterality: N/A;   ESOPHAGOGASTRODUODENOSCOPY (EGD) WITH PROPOFOL  N/A 06/22/2022   Procedure: ESOPHAGOGASTRODUODENOSCOPY (EGD) WITH PROPOFOL ;  Surgeon: Dianna Specking, MD;  Location: [facility];  Service: Gastroenterology;  Laterality: N/A;   NO PAST SURGERIES       Family History  Problem Relation Age of Onset   Diabetes Mother    Hypertension Mother    Diabetes Father    Hypertension Father    Lymphoma Maternal Grandmother    Stroke Maternal Grandmother    Heart disease Paternal Grandfather     Social History:  reports that she has never smoked. She has never used smokeless tobacco. She reports that she does not currently use alcohol. She reports that she does not use drugs.  Allergies:  Allergies  Allergen Reactions   Cinnamon Itching   Nsaids     AVOIDS DUE TO UC   Protonix  [Pantoprazole ] Hives and Swelling    No medications prior to admission.    Review of Systems Denies F/C/N/V/D  Height 5' 4 (1.626 m), weight 110.7 kg. Physical Exam Lungs unlabored breathing CV RRR Abdomen enlarged uterus, 30wk size, abdomen soft, NT Extremities no calf tenderness  No results found for this or any previous visit (from the past 24 hours).  No results found.  Assessment/Plan: 43yo G0 with symptomatic fibroids presenting for definitive mgmt with TAH/BS/possible cystoscopy.  Pt is anemic with Hgb 8.8.  Will plan cell saver intra-op.  Risks and benefits and alternatives reviewed and pt is agreeable to a blood transfusion as indicated.  Risks benefits and alternatives of TAH/BS have been discussed with the patient including but not limited to bleeding infection and injury.  She also understands she may need a vertical incision.  [name] 10/09/2024, 7:37 AM

## 2024-10-09 NOTE — Anesthesia Postprocedure Evaluation (Signed)
 Anesthesia Post Note  Patient: Dana Campbell  Procedure(s) Performed: HYSTERECTOMY, TOTAL, ABDOMINAL, WITH BILATERAL SALPINGECTOMY APPLICATION OF CELL SAVER CYSTOSCOPY     Patient location during evaluation: PACU Anesthesia Type: General Level of consciousness: awake Pain management: pain level controlled Vital Signs Assessment: post-procedure vital signs reviewed and stable Respiratory status: spontaneous breathing, nonlabored ventilation and respiratory function stable Cardiovascular status: blood pressure returned to baseline and stable Postop Assessment: no apparent nausea or vomiting Anesthetic complications: no   No notable events documented.  Last Vitals:  Vitals:   10/09/24 1530 10/09/24 1545  BP: 139/86   Pulse: 85 85  Resp: 15 16  Temp:    SpO2: 97% 98%    Last Pain:  Vitals:   10/09/24 1530  TempSrc:   PainSc: Asleep                 Delon Aisha Arch

## 2024-10-09 NOTE — Op Note (Signed)
 Preop Diagnosis: 1.Symptomatic Intramural leiomyomas of Uterus 2.Menometrorrhagia  Postop Diagnosis: 1.Symptomatic Intramural leiomyomas of Uterus 2.Menometrorrhagia   Procedure: 1.Total Abdominal Hysterectomy 2.Bilateral Salpingectomy 3.Cystoscopy 4.Cell Saver  PR TOTAL ABDOMINAL HYSTERECT W/WO RMVL TUBE OVARY [[id number]] CHG AUTOL BLD/COMPONENT COLLJ STORAGE SALVAGE [[id number]]   Anesthesia: General   Anesthesiologist: Peggye Delon Brunswick, MD   Attending: Henry Slough, MD   Assistant: Ovid All, MD  Findings: Large 30wk size uterus with multiple fibroids, normal appearing fallopian tubes and normal appearing bilateral ovaries with the appearance of a corpus luteal cyst on the left ovary  Pathology: Uterus with Fibroids, Cervix 2.Bilateral Fallopian Tubes  Fluids: 1000 cc  UOP: 100 cc clear urine  EBL: 500cc with return of 200 cc via Cell Saver  Complications: None  Procedure: The patient received intravenous antibiotics and had sequential compression devices applied to her lower extremities while in the preoperative area.   She was taken to the operating room and placed under general anesthesia without difficulty.The abdomen and perineum were prepped and draped in a sterile manner, and she was placed in a dorsal supine position.  A foley catheter was inserted into the bladder and attached to constant drainage. After an adequate timeout was performed, a Pfannensteil skin incision was made. This incision was taken down to the fascia using electrocautery with care given to maintain good hemostasis. The fascia was incised in the midline and the fascial incision was then extended bilaterally using electrocautery without difficulty. The fascia was then dissected off the underlying rectus muscles using blunt and sharp dissection. The rectus muscles were split bluntly in the midline and the peritoneum entered sharply without complication. This peritoneal incision was then  extended superiorly and inferiorly with care given to prevent bowel or bladder injury. The uterus was noted to be enlarged with multiple large fibroids.  A corkscrew was placed in a large fundal fibroid and the uterus was mobilized up to the incision and was delivered up out of the abdomen.  The round ligaments on each side were clamped, suture ligated with 0 Vicryl, and transected with electrocautery allowing entry into the broad ligament. Of note, all sutures used in this procedure are 0 Vicryl unless otherwise noted.   The right fallopian tube was elevated with a babcock clamped immediately beneath it at the mesosalpinx, cut and suture ligated.  The same was done on the contralateral side.  A hole was created in the clear portion of the posterior broad ligament and the utero-ovarian ligament was clamped on the patient's right side, cut, and doubly suture ligated with good hemostasis.  This procedure was repeated in an identical fashion on the opposite side.  A bladder flap was then created.  The bladder was then bluntly dissected off the lower uterine segment and cervix and multiple fibroids in the anterior uterine wall with good hemostasis noted. The uterine arteries were then skeletonized bilaterally and then clamped, cut, and doubly suture ligated with care given to prevent ureteral injury.  The uterus was then amputated across the lower uterine segment.  The uterosacral ligaments were then clamped, cut, and ligated bilaterally.  Finally, the cardinal ligaments were clamped, cut, and suture ligated bilaterally.  Acutely curved clamps were placed across the vagina just under the cervix, and the specimen was amputated and sent to pathology. The vaginal cuff angles were closed with Heaney stiches with care given to incorporate the uterosacral-cardinal ligament pedicles on both sides. The middle of the vaginal cuff was closed with a series of interrupted figure-of-eight  sutures with care given to incorporate the  anterior pubocervical fascia and the posterior rectovaginal fascia.   The pelvis was irrigated and hemostasis was reconfirmed at all pedicles and along the pelvic sidewall.  Attention was turned to the pelvis, foley catheter removed, urethral orifice prepped with betadine and cystoscopy performed.  Peristalsis was noted from both ureters and bladder inspected and no inadvertent bladder injuries noted.  After regowning and regloving attention was returned to the abdomen and pelvis.  Good hemostasis was noted of all pedicles after copious irrigation and arista was placed to ensure hemostasis.  The peritoneum was closed with a running stitch of 2-0 chromic, and the fascia was closed in a running fashion 0 vicryl. The subcutaneous layer was reapproximated with 2-0 plain gut. The skin was closed with a 3-0 monocryl subcuticular stitch. Steristrips with benzoin were applied, a honeycomb dressing placed and a pressure placed as well.  Attention was turned once again to the perineum, speculum placed in the vagina and cuff noted to be intact.  Sponge, lap, needle, and instrument counts were correct times two. The patient was taken to the recovery area awake, extubated and in stable condition.  I was present and scrubbed and the assistant was required due to complexity of anatomy.

## 2024-10-10 ENCOUNTER — Encounter (HOSPITAL_COMMUNITY): Payer: Self-pay | Admitting: Obstetrics and Gynecology

## 2024-10-10 LAB — BASIC METABOLIC PANEL WITH GFR
Anion gap: 11 (ref 5–15)
BUN: 6 mg/dL (ref 6–20)
CO2: 21 mmol/L — ABNORMAL LOW (ref 22–32)
Calcium: 8.1 mg/dL — ABNORMAL LOW (ref 8.9–10.3)
Chloride: 106 mmol/L (ref 98–111)
Creatinine, Ser: 0.6 mg/dL (ref 0.44–1.00)
GFR, Estimated: >60 mL/min (ref >60)
Glucose, Bld: 84 mg/dL (ref 70–99)
Potassium: 4.5 mmol/L (ref 3.5–5.1)
Sodium: 138 mmol/L (ref 135–145)

## 2024-10-10 LAB — CBC
HCT: 27.5 % — ABNORMAL LOW (ref 36.0–46.0)
Hemoglobin: 8.2 g/dL — ABNORMAL LOW (ref 12.0–15.0)
MCH: 22.7 pg — ABNORMAL LOW (ref 26.0–34.0)
MCHC: 29.8 g/dL — ABNORMAL LOW (ref 30.0–36.0)
MCV: 76 fL — ABNORMAL LOW (ref 80.0–100.0)
Platelets: 339 K/uL (ref 150–400)
RBC: 3.62 MIL/uL — ABNORMAL LOW (ref 3.87–5.11)
RDW: 19.5 % — ABNORMAL HIGH (ref 11.5–15.5)
WBC: 7.2 K/uL (ref 4.0–10.5)
nRBC: 0 % (ref 0.0–0.2)

## 2024-10-10 MED ORDER — LORATADINE 10 MG PO TABS
10.0000 mg | ORAL_TABLET | Freq: Every day | ORAL | Status: DC
Start: 2024-10-10 — End: 2024-10-10

## 2024-10-10 MED ORDER — MENTHOL 3 MG MT LOZG
1.0000 | LOZENGE | OROMUCOSAL | Status: DC | PRN
  Filled 2024-10-10: qty 9

## 2024-10-10 MED FILL — Heparin Sodium (Porcine) Inj 1000 Unit/ML: INTRAMUSCULAR | Qty: 30 | Status: AC

## 2024-10-10 MED FILL — Sodium Chloride IV Soln 0.9%: INTRAVENOUS | Qty: 2000 | Status: AC

## 2024-10-10 NOTE — Plan of Care (Signed)

## 2024-10-10 NOTE — Progress Notes (Signed)
 1 Day Post-Op Procedure(s) (LRB): HYSTERECTOMY, TOTAL, ABDOMINAL, WITH BILATERAL SALPINGECTOMY (N/A) APPLICATION OF CELL SAVER (N/A) CYSTOSCOPY  Subjective: Patient reports tolerating PO and no problems voiding.  Passed flatus and ambulating without difficulty.  Objective: I have reviewed patient's vital signs, intake and output, medications, and labs.  General: alert, cooperative, and no distress Resp: unlabored breathing Cardio: regular rate and rhythm GI: soft, app tender, ND Extremities: no calf tenderness Vaginal Bleeding: none Incision c/d/dressing intact  Assessment: s/p Procedure(s) with comments: HYSTERECTOMY, TOTAL, ABDOMINAL, WITH BILATERAL SALPINGECTOMY (N/A) - Gel Mats APPLICATION OF CELL SAVER (N/A) CYSTOSCOPY: progressing well  Plan: Pressure dressing removed and orders placed to change honeycomb dressing Cont routine post op care SCDs converted to lovenox at 2hrs post op for DVT prophylaxis Anticipate discharge tomorrow  LOS: 1 day    Dana CINDERELLA Rummer, MD 10/10/2024, 2:35 PM

## 2024-10-11 ENCOUNTER — Other Ambulatory Visit (HOSPITAL_COMMUNITY): Payer: Self-pay

## 2024-10-11 ENCOUNTER — Other Ambulatory Visit: Payer: Self-pay

## 2024-10-11 LAB — SURGICAL PATHOLOGY

## 2024-10-11 MED ORDER — MESALAMINE ER 0.375 G PO CP24
1.5000 g | ORAL_CAPSULE | Freq: Every morning | ORAL | Status: DC
  Administered 2024-10-11: 1.5 g via ORAL
  Filled 2024-10-11 (×2): qty 4

## 2024-10-11 MED ORDER — ACETAMINOPHEN 500 MG PO TABS
1000.0000 mg | ORAL_TABLET | Freq: Four times a day (QID) | ORAL | 1 refills | Status: AC | PRN
  Filled 2024-10-11: qty 60, 8d supply, fill #0

## 2024-10-11 MED ORDER — OXYCODONE HCL 5 MG PO TABS
5.0000 mg | ORAL_TABLET | Freq: Four times a day (QID) | ORAL | 0 refills | Status: AC | PRN
  Filled 2024-10-11: qty 25, 3d supply, fill #0

## 2024-10-11 NOTE — Progress Notes (Signed)
 Specialty Pharmacy Ongoing Clinical Assessment Note  Dana Campbell is a 43 y.o. female who is being followed by the specialty pharmacy service for RxSp Ulcerative Colitis   Patient's specialty medication(s) reviewed today: Risankizumab -rzaa (Skyrizi )   Missed doses in the last 4 weeks: 0   Patient/Caregiver did not have any additional questions or concerns.   Therapeutic benefit summary: Patient is achieving benefit   Adverse events/side effects summary: No adverse events/side effects   Patient's therapy is appropriate to: Continue    Goals Addressed             This Visit's Progress    Maintain optimal adherence to therapy   On track    Patient is on track. Patient will maintain adherence         Follow up: 12 months  [name] M Pascale Maves Specialty Pharmacist

## 2024-10-11 NOTE — Progress Notes (Signed)
 Discharge AVS reviewed with patient, F?U to be scheduled for 6 weeks, remove dressing in the shower 5-7 days from surgery date. Call MD for fever >100.4, s/s infections. Patient has TOC meds, and her RX meds from pharmacy returned to her. Pt discharged via WC in good condition.

## 2024-10-11 NOTE — Plan of Care (Signed)

## 2024-10-11 NOTE — Discharge Summary (Signed)
 Physician Discharge Summary  Patient ID: Dana Campbell MRN: [id number] DOB/AGE: 03-28-1981 43 y.o.  Admit date: 10/09/2024 Discharge date: 10/11/2024  Admission Diagnoses: Symptomatic Fibroids Menometrorrhagia Admitted for elective TAH/BS Anemia  Discharge Diagnoses:  Principal Problem:   S/P TAH (total abdominal hysterectomy) Anemia  Discharged Condition: stable  Hospital Course: Pt underwent TAH/BS/Cystoscopy and on POD 2 was doing well.  Pt was voiding without difficulty, ambulating well, tolerating po and pain was well controlled.  Discharge instructions discussed as well as continuing iron .  Consults: None  Significant Diagnostic Studies: labs: Hgb 8.2  Treatments: surgery: TAH/BS/Cystoscopy  Discharge Exam: Blood pressure 115/75, pulse 89, temperature 97.6 F (36.4 C), temperature source Axillary, resp. rate 17, height 5' 4 (1.626 m), weight 111.8 kg, last menstrual period 10/02/2024, SpO2 100%. General appearance: alert, cooperative, and no distress Resp: unlabored breathing Cardio: regular rate and rhythm GI: soft, app tender, ND, NABS Extremities: no calf tenderness Incision/Wound: Dressing c/d/i  Disposition: Discharge disposition: 01-Home or Self Care        Allergies as of 10/11/2024       Reactions   Cinnamon Itching   Nsaids    AVOIDS DUE TO UC   Protonix  [pantoprazole ] Hives, Swelling        Medication List     STOP taking these medications    Lupron  Depot (5-Month) 11.25 MG injection Generic drug: leuprolide        TAKE these medications    acetaminophen  500 MG tablet Commonly known as: TYLENOL  Take 2 tablets (1,000 mg total) by mouth every 6 (six) hours as needed for mild pain (pain score 1-3) or moderate pain (pain score 4-6). What changed:  when to take this reasons to take this   calcium carbonate 500 MG chewable tablet Commonly known as: TUMS - dosed in mg elemental calcium Chew 1 tablet by mouth as needed for  indigestion or heartburn.   cetirizine  10 MG tablet Commonly known as: ZYRTEC  Take 10 mg by mouth at bedtime as needed for allergies.   EMERGEN-C IMMUNE PLUS PO Take 1 capsule by mouth daily.   ferrous sulfate 325 (65 FE) MG EC tablet Take 325 mg by mouth 4 (four) times a week.   mesalamine  0.375 g 24 hr capsule Commonly known as: APRISO  Take 4 capsules (1.5 g total) by mouth every morning.   ondansetron  8 MG disintegrating tablet Commonly known as: ZOFRAN -ODT Take 1 tablet (8 mg total) by mouth and allow to dissolve every 8 (eight) hours as needed for nausea. What changed: reasons to take this   oxyCODONE  5 MG immediate release tablet Commonly known as: Oxy IR/ROXICODONE  Take 1-2 tablets (5-10 mg total) by mouth every 6 (six) hours as needed for moderate pain (pain score 4-6), severe pain (pain score 7-10) or breakthrough pain.   Skyrizi  180 MG/1.2ML Soct Generic drug: Risankizumab -rzaa Inject 180mg  subcutanously at week 12, then every 8 weeks What changed:  how much to take how to take this when to take this        Follow-up Information     Henry Slough, MD. Schedule an appointment as soon as possible for a visit in 6 week(s).   Specialty: Obstetrics and Gynecology Contact information: [address] [place] [address] [phone]                 Signed: Slough CINDERELLA Henry 10/11/2024, 12:06 PM

## 2024-10-17 LAB — TYPE AND SCREEN
ABO/RH(D): O POS
Antibody Screen: NEGATIVE
Unit division: 0
Unit division: 0
Unit division: 0
Unit division: 0

## 2024-10-17 LAB — BPAM RBC
Blood Product Expiration Date: 202512082359
Blood Product Expiration Date: 202512082359
Blood Product Expiration Date: 202512082359
Blood Product Expiration Date: 202512082359
ISSUE DATE / TIME: 202511101016
ISSUE DATE / TIME: 202511101016
Unit Type and Rh: 5100
Unit Type and Rh: 5100
Unit Type and Rh: 5100
Unit Type and Rh: 5100

## 2024-10-19 ENCOUNTER — Other Ambulatory Visit: Payer: Self-pay

## 2024-10-20 ENCOUNTER — Other Ambulatory Visit: Payer: Self-pay

## 2024-10-20 NOTE — Progress Notes (Signed)
 Specialty Pharmacy Refill Coordination Note  Dana Campbell is a 43 y.o. female contacted today regarding refills of specialty medication(s) Risankizumab -rzaa (Skyrizi )   Patient requested (Patient-Rptd) Pickup at [facility] at [facility] date: (Patient-Rptd) 10/24/24   Medication will be filled on: 10/23/24

## 2024-10-23 ENCOUNTER — Other Ambulatory Visit: Payer: Self-pay

## 2024-10-23 ENCOUNTER — Other Ambulatory Visit (HOSPITAL_COMMUNITY): Payer: Self-pay

## 2024-11-07 ENCOUNTER — Other Ambulatory Visit: Payer: Self-pay

## 2024-11-13 ENCOUNTER — Other Ambulatory Visit: Payer: Self-pay

## 2024-11-15 ENCOUNTER — Other Ambulatory Visit (HOSPITAL_COMMUNITY): Payer: Self-pay

## 2024-11-15 DIAGNOSIS — K5100 Ulcerative (chronic) pancolitis without complications: Secondary | ICD-10-CM | POA: Diagnosis not present

## 2024-11-15 MED ORDER — MESALAMINE ER 0.375 G PO CP24
1.5000 g | ORAL_CAPSULE | Freq: Every morning | ORAL | 3 refills | Status: AC
  Filled 2024-11-15 – 2024-12-05 (×2): qty 360, 90d supply, fill #0

## 2024-11-21 DIAGNOSIS — K5100 Ulcerative (chronic) pancolitis without complications: Secondary | ICD-10-CM | POA: Diagnosis not present

## 2024-11-27 DIAGNOSIS — N898 Other specified noninflammatory disorders of vagina: Secondary | ICD-10-CM | POA: Diagnosis not present

## 2024-11-27 DIAGNOSIS — Z4889 Encounter for other specified surgical aftercare: Secondary | ICD-10-CM | POA: Diagnosis not present

## 2024-11-29 ENCOUNTER — Other Ambulatory Visit (HOSPITAL_COMMUNITY): Payer: Self-pay

## 2024-12-05 ENCOUNTER — Other Ambulatory Visit (HOSPITAL_COMMUNITY): Payer: Self-pay

## 2024-12-05 ENCOUNTER — Other Ambulatory Visit: Payer: Self-pay

## 2024-12-08 ENCOUNTER — Other Ambulatory Visit (HOSPITAL_COMMUNITY): Payer: Self-pay

## 2024-12-08 MED ORDER — ONDANSETRON 8 MG PO TBDP
8.0000 mg | ORAL_TABLET | Freq: Every day | ORAL | 0 refills | Status: AC | PRN
  Filled 2024-12-08: qty 30, 30d supply, fill #0

## 2024-12-12 ENCOUNTER — Other Ambulatory Visit: Payer: Self-pay

## 2024-12-12 ENCOUNTER — Encounter (INDEPENDENT_AMBULATORY_CARE_PROVIDER_SITE_OTHER): Payer: Self-pay

## 2024-12-13 ENCOUNTER — Other Ambulatory Visit: Payer: Self-pay

## 2024-12-13 ENCOUNTER — Other Ambulatory Visit (HOSPITAL_COMMUNITY): Payer: Self-pay

## 2024-12-13 ENCOUNTER — Other Ambulatory Visit: Payer: Self-pay | Admitting: Pharmacy Technician

## 2024-12-13 NOTE — Progress Notes (Signed)
 Specialty Pharmacy Refill Coordination Note  Dana Campbell is a 44 y.o. female contacted today regarding refills of specialty medication(s) Risankizumab -rzaa (Skyrizi )   Patient requested Delivery   Delivery date: 12/19/24   Verified address: [address], [place] [address]   Medication will be filled on: 12/18/24

## 2024-12-13 NOTE — Progress Notes (Signed)
 Specialty Pharmacy Refill Coordination Note  Dana Campbell is a 44 y.o. female contacted today regarding refills of specialty medication(s) Risankizumab -rzaa (Skyrizi )   Patient requested Delivery   Delivery date: 12/14/24   Verified address: [address] [place] [address]   Medication will be filled on: 12/13/24

## 2025-01-03 ENCOUNTER — Other Ambulatory Visit (HOSPITAL_COMMUNITY): Payer: Self-pay

## 2025-01-03 MED ORDER — PREDNISONE 10 MG PO TABS
ORAL_TABLET | ORAL | 0 refills | Status: AC
  Filled 2025-01-03: qty 70, 28d supply, fill #0

## 2025-01-05 ENCOUNTER — Other Ambulatory Visit: Payer: Self-pay
